# Patient Record
Sex: Female | Born: 1990 | Race: Black or African American | Hispanic: No | Smoking: Never smoker
Health system: Southern US, Community
[De-identification: ages and names within clinical notes are randomized; demographics above are authoritative.]

## PROBLEM LIST (undated history)

## (undated) ENCOUNTER — Inpatient Hospital Stay: Payer: Self-pay

## (undated) DIAGNOSIS — E119 Type 2 diabetes mellitus without complications: Secondary | ICD-10-CM

---

## 2013-05-14 ENCOUNTER — Emergency Department: Payer: Self-pay | Admitting: Emergency Medicine

## 2013-05-16 LAB — BETA STREP CULTURE(ARMC)

## 2013-12-30 ENCOUNTER — Emergency Department: Payer: Self-pay | Admitting: Emergency Medicine

## 2013-12-31 LAB — BETA STREP CULTURE(ARMC)

## 2014-01-28 ENCOUNTER — Emergency Department: Payer: Self-pay | Admitting: Internal Medicine

## 2014-01-28 LAB — CBC WITH DIFFERENTIAL/PLATELET
Basophil #: 0.1 10*3/uL (ref 0.0–0.1)
Basophil %: 0.8 %
Eosinophil #: 0.1 10*3/uL (ref 0.0–0.7)
Eosinophil %: 1 %
HCT: 37.8 % (ref 35.0–47.0)
HGB: 12.6 g/dL (ref 12.0–16.0)
LYMPHS ABS: 1.6 10*3/uL (ref 1.0–3.6)
Lymphocyte %: 24.4 %
MCH: 28.6 pg (ref 26.0–34.0)
MCHC: 33.4 g/dL (ref 32.0–36.0)
MCV: 86 fL (ref 80–100)
Monocyte #: 0.5 x10 3/mm (ref 0.2–0.9)
Monocyte %: 7.7 %
NEUTROS PCT: 66.1 %
Neutrophil #: 4.3 10*3/uL (ref 1.4–6.5)
PLATELETS: 247 10*3/uL (ref 150–440)
RBC: 4.42 10*6/uL (ref 3.80–5.20)
RDW: 14 % (ref 11.5–14.5)
WBC: 6.5 10*3/uL (ref 3.6–11.0)

## 2014-01-28 LAB — URINALYSIS, COMPLETE
Bacteria: NONE SEEN
Bilirubin,UR: NEGATIVE
Blood: NEGATIVE
GLUCOSE, UR: NEGATIVE mg/dL (ref 0–75)
KETONE: NEGATIVE
LEUKOCYTE ESTERASE: NEGATIVE
NITRITE: NEGATIVE
Ph: 7 (ref 4.5–8.0)
Protein: NEGATIVE
Specific Gravity: 1.016 (ref 1.003–1.030)
WBC UR: <1 /HPF (ref 0–5)

## 2014-01-28 LAB — COMPREHENSIVE METABOLIC PANEL
ALT: 16 U/L (ref 12–78)
Albumin: 3.6 g/dL (ref 3.4–5.0)
Alkaline Phosphatase: 59 U/L
Anion Gap: 6 — ABNORMAL LOW (ref 7–16)
BUN: 13 mg/dL (ref 7–18)
Bilirubin,Total: 0.3 mg/dL (ref 0.2–1.0)
Calcium, Total: 9 mg/dL (ref 8.5–10.1)
Chloride: 106 mmol/L (ref 98–107)
Co2: 24 mmol/L (ref 21–32)
Creatinine: 0.84 mg/dL (ref 0.60–1.30)
EGFR (African American): >60
EGFR (Non-African Amer.): >60
Glucose: 102 mg/dL — ABNORMAL HIGH (ref 65–99)
OSMOLALITY: 272 (ref 275–301)
Potassium: 3.8 mmol/L (ref 3.5–5.1)
SGOT(AST): 13 U/L — ABNORMAL LOW (ref 15–37)
Sodium: 136 mmol/L (ref 136–145)
TOTAL PROTEIN: 7.5 g/dL (ref 6.4–8.2)

## 2014-01-28 LAB — HCG, QUANTITATIVE, PREGNANCY: Beta Hcg, Quant.: 295 m[IU]/mL — ABNORMAL HIGH

## 2014-01-28 LAB — WET PREP, GENITAL

## 2014-01-28 LAB — GC/CHLAMYDIA PROBE AMP

## 2014-01-28 LAB — LIPASE, BLOOD: Lipase: 398 U/L — ABNORMAL HIGH (ref 73–393)

## 2014-02-06 ENCOUNTER — Emergency Department: Payer: Self-pay | Admitting: Emergency Medicine

## 2014-02-06 LAB — CBC
HCT: 35.7 % (ref 35.0–47.0)
HGB: 12 g/dL (ref 12.0–16.0)
MCH: 28.6 pg (ref 26.0–34.0)
MCHC: 33.6 g/dL (ref 32.0–36.0)
MCV: 85 fL (ref 80–100)
PLATELETS: 260 10*3/uL (ref 150–440)
RBC: 4.19 10*6/uL (ref 3.80–5.20)
RDW: 14 % (ref 11.5–14.5)
WBC: 6.3 10*3/uL (ref 3.6–11.0)

## 2014-02-06 LAB — HCG, QUANTITATIVE, PREGNANCY: Beta Hcg, Quant.: 14040 m[IU]/mL — ABNORMAL HIGH

## 2014-02-10 ENCOUNTER — Ambulatory Visit: Payer: Self-pay | Admitting: Obstetrics & Gynecology

## 2014-02-10 LAB — HCG, QUANTITATIVE, PREGNANCY: BETA HCG, QUANT.: 41014 m[IU]/mL — AB

## 2014-03-23 DIAGNOSIS — O09299 Supervision of pregnancy with other poor reproductive or obstetric history, unspecified trimester: Secondary | ICD-10-CM | POA: Insufficient documentation

## 2014-04-23 ENCOUNTER — Emergency Department: Payer: Self-pay | Admitting: Emergency Medicine

## 2014-04-23 LAB — HCG, QUANTITATIVE, PREGNANCY: Beta Hcg, Quant.: 16150 m[IU]/mL — ABNORMAL HIGH

## 2014-05-11 ENCOUNTER — Emergency Department: Payer: Self-pay | Admitting: Emergency Medicine

## 2014-05-11 LAB — URINALYSIS, COMPLETE
BILIRUBIN, UR: NEGATIVE
Blood: NEGATIVE
Glucose,UR: 50 mg/dL (ref 0–75)
Ketone: NEGATIVE
LEUKOCYTE ESTERASE: NEGATIVE
Nitrite: NEGATIVE
PROTEIN: NEGATIVE
Ph: 6 (ref 4.5–8.0)
RBC,UR: NONE SEEN /HPF (ref 0–5)
Specific Gravity: 1.015 (ref 1.003–1.030)
WBC UR: 2 /HPF (ref 0–5)

## 2014-05-11 LAB — WET PREP, GENITAL

## 2014-05-11 LAB — GC/CHLAMYDIA PROBE AMP

## 2014-05-11 LAB — HCG, QUANTITATIVE, PREGNANCY: Beta Hcg, Quant.: 9400 m[IU]/mL — ABNORMAL HIGH

## 2014-05-12 LAB — URINE CULTURE

## 2014-12-28 ENCOUNTER — Emergency Department: Payer: Self-pay | Admitting: Emergency Medicine

## 2015-01-31 ENCOUNTER — Emergency Department: Payer: Self-pay | Admitting: Emergency Medicine

## 2015-03-15 ENCOUNTER — Emergency Department: Payer: Self-pay

## 2015-03-15 ENCOUNTER — Emergency Department
Admission: EM | Admit: 2015-03-15 | Discharge: 2015-03-15 | Disposition: A | Discharge status: Home / Self Care | Payer: Self-pay | Attending: Emergency Medicine | Admitting: Emergency Medicine

## 2015-03-15 DIAGNOSIS — Z880 Allergy status to penicillin: Secondary | ICD-10-CM | POA: Insufficient documentation

## 2015-03-15 DIAGNOSIS — N83201 Unspecified ovarian cyst, right side: Secondary | ICD-10-CM

## 2015-03-15 DIAGNOSIS — R102 Pelvic and perineal pain: Secondary | ICD-10-CM

## 2015-03-15 DIAGNOSIS — Z3202 Encounter for pregnancy test, result negative: Secondary | ICD-10-CM | POA: Insufficient documentation

## 2015-03-15 DIAGNOSIS — E119 Type 2 diabetes mellitus without complications: Secondary | ICD-10-CM | POA: Insufficient documentation

## 2015-03-15 DIAGNOSIS — N8320 Unspecified ovarian cysts: Secondary | ICD-10-CM | POA: Insufficient documentation

## 2015-03-15 DIAGNOSIS — Z793 Long term (current) use of hormonal contraceptives: Secondary | ICD-10-CM | POA: Insufficient documentation

## 2015-03-15 HISTORY — DX: Type 2 diabetes mellitus without complications: E11.9

## 2015-03-15 LAB — URINALYSIS COMPLETE WITH MICROSCOPIC (ARMC ONLY)
BILIRUBIN URINE: NEGATIVE
Bacteria, UA: NONE SEEN
Glucose, UA: NEGATIVE mg/dL
Ketones, ur: NEGATIVE mg/dL
LEUKOCYTES UA: NEGATIVE
Nitrite: NEGATIVE
PH: 6 (ref 5.0–8.0)
PROTEIN: NEGATIVE mg/dL
RBC / HPF: NONE SEEN RBC/hpf (ref 0–5)
Specific Gravity, Urine: 1.003 — ABNORMAL LOW (ref 1.005–1.030)
Squamous Epithelial / LPF: NONE SEEN

## 2015-03-15 LAB — PREGNANCY, URINE: PREG TEST UR: NEGATIVE

## 2015-03-15 LAB — POCT PREGNANCY, URINE: Preg Test, Ur: NEGATIVE

## 2015-03-15 NOTE — ED Notes (Signed)
Pt complains of cramping and her period being 13 days late.  Reports neg preg test at home.

## 2015-03-15 NOTE — Discharge Instructions (Signed)
Abdominal Pain, Women °Abdominal (stomach, pelvic, or belly) pain can be caused by many things. It is important to tell your doctor: °· The location of the pain. °· Does it come and go or is it present all the time? °· Are there things that start the pain (eating certain foods, exercise)? °· Are there other symptoms associated with the pain (fever, nausea, vomiting, diarrhea)? °All of this is helpful to know when trying to find the cause of the pain. °CAUSES  °· Stomach: virus or bacteria infection, or ulcer. °· Intestine: appendicitis (inflamed appendix), regional ileitis (Crohn's disease), ulcerative colitis (inflamed colon), irritable bowel syndrome, diverticulitis (inflamed diverticulum of the colon), or cancer of the stomach or intestine. °· Gallbladder disease or stones in the gallbladder. °· Kidney disease, kidney stones, or infection. °· Pancreas infection or cancer. °· Fibromyalgia (pain disorder). °· Diseases of the female organs: °· Uterus: fibroid (non-cancerous) tumors or infection. °· Fallopian tubes: infection or tubal pregnancy. °· Ovary: cysts or tumors. °· Pelvic adhesions (scar tissue). °· Endometriosis (uterus lining tissue growing in the pelvis and on the pelvic organs). °· Pelvic congestion syndrome (female organs filling up with blood just before the menstrual period). °· Pain with the menstrual period. °· Pain with ovulation (producing an egg). °· Pain with an IUD (intrauterine device, birth control) in the uterus. °· Cancer of the female organs. °· Functional pain (pain not caused by a disease, may improve without treatment). °· Psychological pain. °· Depression. °DIAGNOSIS  °Your doctor will decide the seriousness of your pain by doing an examination. °· Blood tests. °· X-rays. °· Ultrasound. °· CT scan (computed tomography, special type of X-ray). °· MRI (magnetic resonance imaging). °· Cultures, for infection. °· Barium enema (dye inserted in the large intestine, to better view it with  X-rays). °· Colonoscopy (looking in intestine with a lighted tube). °· Laparoscopy (minor surgery, looking in abdomen with a lighted tube). °· Major abdominal exploratory surgery (looking in abdomen with a large incision). °TREATMENT  °The treatment will depend on the cause of the pain.  °· Many cases can be observed and treated at home. °· Over-the-counter medicines recommended by your caregiver. °· Prescription medicine. °· Antibiotics, for infection. °· Birth control pills, for painful periods or for ovulation pain. °· Hormone treatment, for endometriosis. °· Nerve blocking injections. °· Physical therapy. °· Antidepressants. °· Counseling with a psychologist or psychiatrist. °· Minor or major surgery. °HOME CARE INSTRUCTIONS  °· Do not take laxatives, unless directed by your caregiver. °· Take over-the-counter pain medicine only if ordered by your caregiver. Do not take aspirin because it can cause an upset stomach or bleeding. °· Try a clear liquid diet (broth or water) as ordered by your caregiver. Slowly move to a bland diet, as tolerated, if the pain is related to the stomach or intestine. °· Have a thermometer and take your temperature several times a day, and record it. °· Bed rest and sleep, if it helps the pain. °· Avoid sexual intercourse, if it causes pain. °· Avoid stressful situations. °· Keep your follow-up appointments and tests, as your caregiver orders. °· If the pain does not go away with medicine or surgery, you may try: °¨ Acupuncture. °¨ Relaxation exercises (yoga, meditation). °¨ Group therapy. °¨ Counseling. °SEEK MEDICAL CARE IF:  °· You notice certain foods cause stomach pain. °· Your home care treatment is not helping your pain. °· You need stronger pain medicine. °· You want your IUD removed. °· You feel faint or   lightheaded. °· You develop nausea and vomiting. °· You develop a rash. °· You are having side effects or an allergy to your medicine. °SEEK IMMEDIATE MEDICAL CARE IF:  °· Your  pain does not go away or gets worse. °· You have a fever. °· Your pain is felt only in portions of the abdomen. The right side could possibly be appendicitis. The left lower portion of the abdomen could be colitis or diverticulitis. °· You are passing blood in your stools (bright red or black tarry stools, with or without vomiting). °· You have blood in your urine. °· You develop chills, with or without a fever. °· You pass out. °MAKE SURE YOU:  °· Understand these instructions. °· Will watch your condition. °· Will get help right away if you are not doing well or get worse. °Document Released: 08/18/2007 Document Revised: 03/07/2014 Document Reviewed: 09/07/2009 °[employer]® Patient Information ©2015 [employer]. This information is not intended to replace advice given to you by your health care provider. Make sure you discuss any questions you have with your health care provider. ° °Ovarian Cyst °An ovarian cyst is a sac filled with fluid or blood. This sac is attached to the ovary. Some cysts go away on their own. Other cysts need treatment.  °HOME CARE  °· Only take medicine as told by your doctor. °· Follow up with your doctor as told. °· Get regular pelvic exams and Pap tests. °GET HELP IF: °· Your periods are late, not regular, or painful. °· You stop having periods. °· Your belly (abdominal) or pelvic pain does not go away. °· Your belly becomes large or puffy (swollen). °· You have a hard time peeing (totally emptying your bladder). °· You have pressure on your bladder. °· You have pain during sex. °· You feel fullness, pressure, or discomfort in your belly. °· You lose weight for no reason. °· You feel sick most of the time. °· You have a hard time pooping (constipation). °· You do not feel like eating. °· You develop pimples (acne). °· You have an increase in hair on your body and face. °· You are gaining weight for no reason. °· You think you are pregnant. °GET HELP RIGHT AWAY IF:  °· Your belly pain  gets worse. °· You feel sick to your stomach (nauseous), and you throw up (vomit). °· You have a fever that comes on fast. °· You have belly pain while pooping (bowel movement). °· Your periods are heavier than usual. °MAKE SURE YOU:  °· Understand these instructions. °· Will watch your condition. °· Will get help right away if you are not doing well or get worse. °Document Released: 04/08/2008 Document Revised: 08/11/2013 Document Reviewed: 06/28/2013 °[employer]® Patient Information ©2015 [employer]. This information is not intended to replace advice given to you by your health care provider. Make sure you discuss any questions you have with your health care provider. ° °

## 2015-03-15 NOTE — ED Provider Notes (Signed)
[facility] Emergency Department Provider Note    Time seen: 1630  I have reviewed the triage vital signs and the nursing notes.   HISTORY  Chief Complaint Abdominal Cramping    HPI Danielle Mcdaniel is a 24 y.o. female patient's ER for amenorrhea. Patient states this is the fourth time in the last 6 months perhaps that her period has been late, she is taken home pregnancy test and they have all been negative. Does describe some mild to moderate right sided abdominal and pelvic cramping. Denies any vaginal discharge or any spotting or bleeding of any kind.    Past Medical History  Diagnosis Date  . Diabetes mellitus without complication     There are no active problems to display for this patient.   Past Surgical History  Procedure Laterality Date  . Cesarean section      No current outpatient prescriptions on file.  Allergies Penicillins  No family history on file.  Social History History  Substance Use Topics  . Smoking status: Never Smoker   . Smokeless tobacco: Not on file  . Alcohol Use: Yes    Review of Systems Constitutional: Negative for fever. Eyes: Negative for visual changes. ENT: Negative for sore throat. Cardiovascular: Negative for chest pain. Respiratory: Negative for shortness of breath. Gastrointestinal: Negative for abdominal pain, vomiting and diarrhea. Genitourinary: Negative for dysuria, positive for pelvic cramping Musculoskeletal: Negative for back pain. Skin: Negative for rash. Neurological: Negative for headaches, focal weakness or numbness.  10-point ROS otherwise negative.  ____________________________________________   PHYSICAL EXAM:  VITAL SIGNS: ED Triage Vitals  Enc Vitals Group     BP 03/15/15 1306 133/72 mmHg     Pulse Rate 03/15/15 1306 75     Resp 03/15/15 1306 18     Temp 03/15/15 1306 98.2 F (36.8 C)     Temp Source 03/15/15 1306 Oral     SpO2 03/15/15 1306 99 %     Weight 03/15/15  1306 240 lb (108.863 kg)     Height 03/15/15 1306 5\' 6"  (1.676 m)     Head Cir --      Peak Flow --      Pain Score 03/15/15 1307 2     Pain Loc --      Pain Edu? --      Excl. in GC? --     Constitutional: Alert and oriented. Well appearing and in no distress. Eyes: Conjunctivae are normal. PERRL. Normal extraocular movements. ENT   Head: Normocephalic and atraumatic.   Nose: No congestion/rhinnorhea.   Mouth/Throat: Mucous membranes are moist.   Neck: No stridor. Hematological/Lymphatic/Immunilogical: No cervical lymphadenopathy. Cardiovascular: Normal rate, regular rhythm. Normal and symmetric distal pulses are present in all extremities. No murmurs, rubs, or gallops. Respiratory: Normal respiratory effort without tachypnea nor retractions. Breath sounds are clear and equal bilaterally. No wheezes/rales/rhonchi. Gastrointestinal: Soft and nontender. No distention. No abdominal bruits. There is no CVA tenderness. Musculoskeletal: Nontender with normal range of motion in all extremities. No joint effusions.  No lower extremity tenderness nor edema. Neurologic:  Normal speech and language. No gross focal neurologic deficits are appreciated. Speech is normal. No gait instability. Skin:  Skin is warm, dry and intact. No rash noted. Psychiatric: Mood and affect are normal. Speech and behavior are normal. Patient exhibits appropriate insight and judgment.  ____________________________________________    LABS (pertinent positives/negatives)  Labs Reviewed  URINALYSIS COMPLETEWITH MICROSCOPIC ([facility])  - Abnormal; Notable for the following:  Color, Urine COLORLESS (*)    APPearance CLEAR (*)    Specific Gravity, Urine 1.003 (*)    Hgb urine dipstick 1+ (*)    All other components within normal limits  PREGNANCY, URINE  POCT PREGNANCY, URINE     ____________________________________________    RADIOLOGY  Pelvic  ultrasound  ____________________________________________    ED COURSE  Pertinent labs & imaging results that were available during my care of the patient were reviewed by me and considered in my medical decision making (see chart for details).  Patient has been on exam, we will ultrasound due to frequent ER visits for same.  FINAL ASSESSMENT AND PLAN  Irregular menses   Plan: Patient declines oral contraceptive pills, we'll refer her to OB/GYN for follow-up    Emily FilbertWilliams, Andree Heeg E, MD   Emily FilbertJonathan E Dorethy Tomey, MD 03/15/15 2123

## 2015-03-15 NOTE — ED Notes (Signed)
Pt reports 4th time her period has been late.  Reports period is 13 days late.  Reports took pregnancy test at home and they have been negative.  Complains of cramping.

## 2015-03-15 NOTE — ED Notes (Signed)
Urine pregnancy -  Negative 

## 2015-05-01 ENCOUNTER — Emergency Department
Admission: EM | Admit: 2015-05-01 | Discharge: 2015-05-01 | Disposition: A | Discharge status: Home / Self Care | Payer: Self-pay | Attending: Emergency Medicine | Admitting: Emergency Medicine

## 2015-05-01 ENCOUNTER — Encounter: Payer: Self-pay | Admitting: General Practice

## 2015-05-01 DIAGNOSIS — Z3201 Encounter for pregnancy test, result positive: Secondary | ICD-10-CM | POA: Insufficient documentation

## 2015-05-01 DIAGNOSIS — Z331 Pregnant state, incidental: Secondary | ICD-10-CM | POA: Insufficient documentation

## 2015-05-01 DIAGNOSIS — Z3490 Encounter for supervision of normal pregnancy, unspecified, unspecified trimester: Secondary | ICD-10-CM

## 2015-05-01 DIAGNOSIS — E119 Type 2 diabetes mellitus without complications: Secondary | ICD-10-CM | POA: Insufficient documentation

## 2015-05-01 DIAGNOSIS — Z880 Allergy status to penicillin: Secondary | ICD-10-CM | POA: Insufficient documentation

## 2015-05-01 LAB — POCT PREGNANCY, URINE: Preg Test, Ur: POSITIVE — AB

## 2015-05-01 NOTE — ED Notes (Signed)
Pt. Arrived to ed from home with reports of needing a pregnancy test. Pt states "i am pretty sure i am pregnant and i need proof for social services". Pt alert and oriented. Denies vaginal discharge at this time.

## 2015-05-01 NOTE — ED Provider Notes (Signed)
[facility] Emergency Department Provider Note  ____________________________________________  Time seen: Approximately 4:20 PM  I have reviewed the triage vital signs and the nursing notes.   HISTORY  Chief Complaint Possible Pregnancy    HPI Danielle Mcdaniel is a 24 y.o. female presents for request seen a pregnancy test. Patient states her LMP was 03/22/2015. Denies any complaints at this time.   Past Medical History  Diagnosis Date  . Diabetes mellitus without complication     There are no active problems to display for this patient.   Past Surgical History  Procedure Laterality Date  . Cesarean section      No current outpatient prescriptions on file.  Allergies Penicillins  No family history on file.  Social History History  Substance Use Topics  . Smoking status: Never Smoker   . Smokeless tobacco: Not on file  . Alcohol Use: Yes    Review of Systems Constitutional: No fever/chills Eyes: No visual changes. ENT: No sore throat. Cardiovascular: Denies chest pain. Respiratory: Denies shortness of breath. Gastrointestinal: No abdominal pain.  No nausea, no vomiting.  No diarrhea.  No constipation. Genitourinary: Negative for dysuria. Positive for amenorrhea Musculoskeletal: Negative for back pain. Skin: Negative for rash. Neurological: Negative for headaches, focal weakness or numbness.  10-point ROS otherwise negative.  ____________________________________________   PHYSICAL EXAM:  VITAL SIGNS: ED Triage Vitals  Enc Vitals Group     BP 05/01/15 1602 136/67 mmHg     Pulse Rate 05/01/15 1602 74     Resp 05/01/15 1602 18     Temp 05/01/15 1602 98.4 F (36.9 C)     Temp Source 05/01/15 1602 Oral     SpO2 05/01/15 1602 100 %     Weight 05/01/15 1602 260 lb (117.935 kg)     Height 05/01/15 1602  (1.676 m)     Head Cir --      Peak Flow --      Pain Score --      Pain Loc --      Pain Edu? --      Excl. in GC? --      Constitutional: Alert and oriented. Well appearing and in no acute distress.  Cardiovascular: Normal rate, regular rhythm. Grossly normal heart sounds.  Good peripheral circulation. Respiratory: Normal respiratory effort.  No retractions. Lungs CTAB. Gastrointestinal: Soft and nontender. No distention. No abdominal bruits. No CVA tenderness. Musculoskeletal: No lower extremity tenderness nor edema.  No joint effusions. Neurologic:  Normal speech and language. No gross focal neurologic deficits are appreciated. Speech is normal. No gait instability. Skin:  Skin is warm, dry and intact. No rash noted. Psychiatric: Mood and affect are normal. Speech and behavior are normal.  ____________________________________________   LABS (all labs ordered are listed, but only abnormal results are displayed)  Labs Reviewed  POCT PREGNANCY, URINE - Abnormal; Notable for the following:    Preg Test, Ur POSITIVE (*)    All other components within normal limits  POC URINE PREG, ED   ____________________________________________  PROCEDURES  Procedure(s) performed: None  Critical Care performed: No  ____________________________________________   INITIAL IMPRESSION / ASSESSMENT AND PLAN / ED COURSE  Pertinent labs & imaging results that were available during my care of the patient were reviewed by me and considered in my medical decision making (see chart for details).  Positive for pregnancy. Encouraged patient to follow up and seek OB care. Patient voices understanding and will return to the ER with  any worsening symptomology. Denies any other emergency medical complaints at this visit. ____________________________________________   FINAL CLINICAL IMPRESSION(S) / ED DIAGNOSES  Final diagnoses:  Pregnancy      Evangeline Dakinharles M Beers, PA-C 05/01/15 1648  Maurilio LovelyNoelle McLaurin, MD 05/01/15 2214

## 2015-05-01 NOTE — ED Notes (Signed)
States had 2 positive pregnancy tests yesterday, here to confirm pregnancy

## 2015-05-15 ENCOUNTER — Emergency Department
Admission: EM | Admit: 2015-05-15 | Discharge: 2015-05-15 | Disposition: A | Discharge status: Home / Self Care | Payer: Medicaid Other | Attending: Emergency Medicine | Admitting: Emergency Medicine

## 2015-05-15 ENCOUNTER — Encounter: Payer: Self-pay | Admitting: Emergency Medicine

## 2015-05-15 ENCOUNTER — Emergency Department: Payer: Medicaid Other

## 2015-05-15 DIAGNOSIS — N939 Abnormal uterine and vaginal bleeding, unspecified: Secondary | ICD-10-CM

## 2015-05-15 DIAGNOSIS — O209 Hemorrhage in early pregnancy, unspecified: Secondary | ICD-10-CM | POA: Diagnosis present

## 2015-05-15 DIAGNOSIS — O200 Threatened abortion: Secondary | ICD-10-CM | POA: Insufficient documentation

## 2015-05-15 DIAGNOSIS — Z880 Allergy status to penicillin: Secondary | ICD-10-CM | POA: Insufficient documentation

## 2015-05-15 DIAGNOSIS — O24111 Pre-existing diabetes mellitus, type 2, in pregnancy, first trimester: Secondary | ICD-10-CM | POA: Diagnosis not present

## 2015-05-15 DIAGNOSIS — Z3A01 Less than 8 weeks gestation of pregnancy: Secondary | ICD-10-CM | POA: Diagnosis not present

## 2015-05-15 LAB — URINALYSIS COMPLETE WITH MICROSCOPIC (ARMC ONLY)
Bilirubin Urine: NEGATIVE
Glucose, UA: NEGATIVE mg/dL
KETONES UR: NEGATIVE mg/dL
Leukocytes, UA: NEGATIVE
Nitrite: NEGATIVE
PH: 6 (ref 5.0–8.0)
Protein, ur: NEGATIVE mg/dL
Specific Gravity, Urine: 1.018 (ref 1.005–1.030)

## 2015-05-15 LAB — WET PREP, GENITAL
Clue Cells Wet Prep HPF POC: NONE SEEN
Trich, Wet Prep: NONE SEEN
Yeast Wet Prep HPF POC: NONE SEEN

## 2015-05-15 LAB — HCG, QUANTITATIVE, PREGNANCY: hCG, Beta Chain, Quant, S: 33470 m[IU]/mL — ABNORMAL HIGH (ref <5)

## 2015-05-15 LAB — POC URINE PREG, ED: Preg Test, Ur: POSITIVE — AB

## 2015-05-15 LAB — CHLAMYDIA/NGC RT PCR (ARMC ONLY)
Chlamydia Tr: NOT DETECTED
N gonorrhoeae: NOT DETECTED

## 2015-05-15 LAB — CBC
HCT: 36.6 % (ref 35.0–47.0)
Hemoglobin: 12.2 g/dL (ref 12.0–16.0)
MCH: 27.8 pg (ref 26.0–34.0)
MCHC: 33.4 g/dL (ref 32.0–36.0)
MCV: 83.2 fL (ref 80.0–100.0)
Platelets: 295 10*3/uL (ref 150–440)
RBC: 4.4 MIL/uL (ref 3.80–5.20)
RDW: 13.7 % (ref 11.5–14.5)
WBC: 5.9 10*3/uL (ref 3.6–11.0)

## 2015-05-15 LAB — ABO/RH
ABO/RH(D): B POS
ABO/RH(D): B POS

## 2015-05-15 NOTE — Discharge Instructions (Signed)
Please seek medical attention for any high fevers, chest pain, shortness of breath, change in behavior, persistent vomiting, bloody stool or any other new or concerning symptoms. ° ° °Threatened Miscarriage °A threatened miscarriage occurs when you have vaginal bleeding during your first 20 weeks of pregnancy but the pregnancy has not ended. If you have vaginal bleeding during this time, your health care provider will do tests to make sure you are still pregnant. If the tests show you are still pregnant and the developing baby (fetus) inside your womb (uterus) is still growing, your condition is considered a threatened miscarriage. °A threatened miscarriage does not mean your pregnancy will end, but it does increase the risk of losing your pregnancy (complete miscarriage). °CAUSES  °The cause of a threatened miscarriage is usually not known. If you go on to have a complete miscarriage, the most common cause is an abnormal number of chromosomes in the developing baby. Chromosomes are the structures inside cells that hold all your genetic material. °Some causes of vaginal bleeding that do not result in miscarriage include: °· Having sex. °· Having an infection. °· Normal hormone changes of pregnancy. °· Bleeding that occurs when an egg implants in your uterus. °RISK FACTORS °Risk factors for bleeding in early pregnancy include: °· Obesity. °· Smoking. °· Drinking excessive amounts of alcohol or caffeine. °· Recreational drug use. °SIGNS AND SYMPTOMS °· Light vaginal bleeding. °· Mild abdominal pain or cramps. °DIAGNOSIS  °If you have bleeding with or without abdominal pain before 20 weeks of pregnancy, your health care provider will do tests to check whether you are still pregnant. One important test involves using sound waves and a computer (ultrasound) to create images of the inside of your uterus. Other tests include an internal exam of your vagina and uterus (pelvic exam) and measurement of your baby's heart rate.   °You may be diagnosed with a threatened miscarriage if: °· Ultrasound testing shows you are still pregnant. °· Your baby's heart rate is strong. °· A pelvic exam shows that the opening between your uterus and your vagina (cervix) is closed. °· Your heart rate and blood pressure are stable. °· Blood tests confirm you are still pregnant. °TREATMENT  °No treatments have been shown to prevent a threatened miscarriage from going on to a complete miscarriage. However, the right home care is important.  °HOME CARE INSTRUCTIONS  °· Make sure you keep all your appointments for prenatal care. This is very important. °· Get plenty of rest. °· Do not have sex or use tampons if you have vaginal bleeding. °· Do not douche. °· Do not smoke or use recreational drugs. °· Do not drink alcohol. °· Avoid caffeine. °SEEK MEDICAL CARE IF: °· You have light vaginal bleeding or spotting while pregnant. °· You have abdominal pain or cramping. °· You have a fever. °SEEK IMMEDIATE MEDICAL CARE IF: °· You have heavy vaginal bleeding. °· You have blood clots coming from your vagina. °· You have severe low back pain or abdominal cramps. °· You have fever, chills, and severe abdominal pain. °MAKE SURE YOU: °· Understand these instructions. °· Will watch your condition. °· Will get help right away if you are not doing well or get worse. °Document Released: 10/21/2005 Document Revised: 10/26/2013 Document Reviewed: 08/17/2013 °[employer]® Patient Information ©2015 [employer]. This information is not intended to replace advice given to you by your health care provider. Make sure you discuss any questions you have with your health care provider. ° °

## 2015-05-15 NOTE — ED Notes (Signed)
Pt presents to ER alert and in NAD. pt reports she is approx [redacted] weeks pregnant and has not had any prenatal care yet. Pt reports she woke up this morning with dark red vaginal bleeding.

## 2015-05-15 NOTE — ED Provider Notes (Signed)
[facility] Emergency Department Provider Note  ____________________________________________  Time seen: 0730  I have reviewed the triage vital signs and the nursing notes.   HISTORY  Chief Complaint Vaginal Bleeding   History limited by: Not Limited   HPI Danielle Mcdaniel is a 24 y.o. female at roughly [redacted] weeks pregnant who presents to the emergency department today because of vaginal bleeding. The patient states she woke up this morning and noticed blood. It was dark red. No passage of tissue noted. She states she thinks she has now stopped bleeding. She did not have any pain associated with the bleeding. She has had some mild cramping with this pregnancy has not experienced any significant nausea or vomiting. States she is taking her prenatal vitamins. States she has her first appointment with her OB/GYN doctor tomorrow.     Past Medical History  Diagnosis Date  . Diabetes mellitus without complication     There are no active problems to display for this patient.   Past Surgical History  Procedure Laterality Date  . Cesarean section      No current outpatient prescriptions on file.  Allergies Penicillins  History reviewed. No pertinent family history.  Social History History  Substance Use Topics  . Smoking status: Never Smoker   . Smokeless tobacco: Not on file  . Alcohol Use: Yes    Review of Systems Constitutional: Negative for fever. Cardiovascular: Negative for chest pain. Respiratory: Negative for shortness of breath. Gastrointestinal: Minimal lower abdominal cramping Genitourinary: Negative for dysuria. Musculoskeletal: Negative for back pain. Skin: Negative for rash. Neurological: Negative for headaches, focal weakness or numbness.   10-point ROS otherwise negative.  ____________________________________________   PHYSICAL EXAM:  VITAL SIGNS: ED Triage Vitals  Enc Vitals Group     BP 05/15/15 0529 144/76 mmHg   Pulse Rate 05/15/15 0529 95     Resp 05/15/15 0529 20     Temp 05/15/15 0529 98.9 F (37.2 C)     Temp Source 05/15/15 0529 Oral     SpO2 05/15/15 0529 99 %     Weight 05/15/15 0529 254 lb (115.214 kg)     Height 05/15/15 0529 5\' 6"  (1.676 m)     Head Cir --      Peak Flow --      Pain Score 05/15/15 0529 2   Constitutional: Alert and oriented. Well appearing and in no distress. Eyes: Conjunctivae are normal. PERRL. Normal extraocular movements. ENT   Head: Normocephalic and atraumatic.   Nose: No congestion/rhinnorhea.   Mouth/Throat: Mucous membranes are moist.   Neck: No stridor. Hematological/Lymphatic/Immunilogical: No cervical lymphadenopathy. Cardiovascular: Normal rate, regular rhythm.  No murmurs, rubs, or gallops. Respiratory: Normal respiratory effort without tachypnea nor retractions. Breath sounds are clear and equal bilaterally. No wheezes/rales/rhonchi. Gastrointestinal: Soft and nontender. No distention. There is no CVA tenderness. Genitourinary: No external lesions. Old blood in vaginal vault, no active bleeding identified. No CMT. No adnexal fullness or tenderness.  Musculoskeletal: Normal range of motion in all extremities. No joint effusions.  No lower extremity tenderness nor edema. Neurologic:  Normal speech and language. No gross focal neurologic deficits are appreciated. Speech is normal.  Skin:  Skin is warm, dry and intact. No rash noted. Psychiatric: Mood and affect are normal. Speech and behavior are normal. Patient exhibits appropriate insight and judgment.  ____________________________________________    LABS (pertinent positives/negatives)  Labs Reviewed  URINALYSIS COMPLETEWITH MICROSCOPIC ([facility] ONLY) - Abnormal; Notable for the following:    Color,  Urine YELLOW (*)    APPearance CLEAR (*)    Hgb urine dipstick 3+ (*)    Bacteria, UA RARE (*)    Squamous Epithelial / LPF 0-5 (*)    All other components within normal limits  POC  URINE PREG, ED - Abnormal; Notable for the following:    Preg Test, Ur Positive (*)    All other components within normal limits  CBC  HCG, QUANTITATIVE, PREGNANCY  ABO/RH  ABO/RH     ____________________________________________   EKG  None  ____________________________________________    RADIOLOGY  US Ob Transvaginal IMPRESSION: Single intrauterine gestational sac. Estimated gestational age by crown-rump length is 6 weeks 0 days. Moderate size subchorionic hemorrhage is present. Corpus luteal and simple cyst identified in the right ovary.  ____________________________________________   PROCEDURES  Procedure(s) performed: None  Critical Care performed: No  ____________________________________________   INITIAL IMPRESSION / ASSESSMENT AND PLAN / ED COURSE  Pertinent labs & imaging results that were available during my care of the patient were reviewed by me and considered in my medical decision making (see chart for details).  Patient presents to the emergency department at roughly [redacted] weeks pregnant with complaints of vaginal bleeding. Workup without any concerning findings. Patient is rh positive. Has an appointment with her OB/GYN tomorrow.  ____________________________________________   FINAL CLINICAL IMPRESSION(S) / ED DIAGNOSES  Final diagnoses:  Vaginal bleeding     Phineas Semen, MD 05/15/15 [phone]

## 2015-05-19 ENCOUNTER — Emergency Department: Payer: Medicaid Other

## 2015-05-19 ENCOUNTER — Emergency Department
Admission: EM | Admit: 2015-05-19 | Discharge: 2015-05-19 | Disposition: A | Discharge status: Home / Self Care | Payer: Medicaid Other | Attending: Emergency Medicine | Admitting: Emergency Medicine

## 2015-05-19 DIAGNOSIS — Z880 Allergy status to penicillin: Secondary | ICD-10-CM | POA: Diagnosis not present

## 2015-05-19 DIAGNOSIS — O9989 Other specified diseases and conditions complicating pregnancy, childbirth and the puerperium: Secondary | ICD-10-CM | POA: Diagnosis not present

## 2015-05-19 DIAGNOSIS — O209 Hemorrhage in early pregnancy, unspecified: Secondary | ICD-10-CM | POA: Diagnosis present

## 2015-05-19 DIAGNOSIS — Z3A01 Less than 8 weeks gestation of pregnancy: Secondary | ICD-10-CM | POA: Diagnosis not present

## 2015-05-19 DIAGNOSIS — O24111 Pre-existing diabetes mellitus, type 2, in pregnancy, first trimester: Secondary | ICD-10-CM | POA: Insufficient documentation

## 2015-05-19 DIAGNOSIS — O99213 Obesity complicating pregnancy, third trimester: Secondary | ICD-10-CM | POA: Insufficient documentation

## 2015-05-19 DIAGNOSIS — Z789 Other specified health status: Secondary | ICD-10-CM | POA: Insufficient documentation

## 2015-05-19 DIAGNOSIS — R102 Pelvic and perineal pain: Secondary | ICD-10-CM | POA: Diagnosis not present

## 2015-05-19 DIAGNOSIS — Z98891 History of uterine scar from previous surgery: Secondary | ICD-10-CM | POA: Insufficient documentation

## 2015-05-19 DIAGNOSIS — O26899 Other specified pregnancy related conditions, unspecified trimester: Secondary | ICD-10-CM

## 2015-05-19 LAB — HCG, QUANTITATIVE, PREGNANCY: hCG, Beta Chain, Quant, S: 71539 m[IU]/mL — ABNORMAL HIGH (ref <5)

## 2015-05-19 NOTE — ED Notes (Signed)
Pt c/o suprapubic cramping with light spotting today, states she is [redacted] weeks pregnant and has [facility]

## 2015-05-19 NOTE — ED Notes (Signed)
Patient with no complaints at this time. Respirations even and unlabored. Skin warm/dry. Discharge instructions reviewed with patient at this time. Patient given opportunity to voice concerns/ask questions. Patient discharged at this time and left Emergency Department with steady gait.   

## 2015-05-19 NOTE — ED Provider Notes (Signed)
[facility] Emergency Department Provider Note ____________________________________________  Time seen: Approximately 10:16 PM  I have reviewed the triage vital signs and the nursing notes.   HISTORY  Chief Complaint Vaginal Bleeding   HPI Barney DrainDanajai J Mangione is a 24 y.o. female who presents to the emergency department for evaluation of vaginal bleeding and cramping. Cramping was severe earlier, but very mild now and infrequent. She states the spotting is only present when she wipes and denies urinary symptoms. She has [facility] since her last visit here on 05/15/15. She is a high risk pregnancy. G2P0A0. Her last child was born with congenital abnormalities and passed away 1 hour after birth. She denies nausea or vomiting.   Past Medical History  Diagnosis Date  . Diabetes mellitus without complication     There are no active problems to display for this patient.   Past Surgical History  Procedure Laterality Date  . Cesarean section      No current outpatient prescriptions on file.  Allergies Penicillins  No family history on file.  Social History History  Substance Use Topics  . Smoking status: Never Smoker   . Smokeless tobacco: Not on file  . Alcohol Use: Yes    Review of Systems Constitutional: No fever/chills Cardiovascular: Denies chest pain. Respiratory: Denies shortness of breath or cough. Gastrointestinal: Abdominal pain yes., nausea no, vomiting no. Genitourinary: Dysuria no, vaginal discharge no.. Musculoskeletal: Negative for back pain. Skin: Negative for rash. Neurological: Negative for headaches, focal weakness or numbness.  10-point ROS otherwise negative.  ____________________________________________   PHYSICAL EXAM:  VITAL SIGNS: ED Triage Vitals  Enc Vitals Group     BP 05/19/15 1808 104/82 mmHg     Pulse Rate 05/19/15 1808 85     Resp 05/19/15 1808 18     Temp 05/19/15 1808 98.8 F (37.1 C)     Temp  Source 05/19/15 1808 Oral     SpO2 05/19/15 1808 99 %     Weight 05/19/15 1808 252 lb (114.306 kg)     Height 05/19/15 1808 5\' 6"  (1.676 m)     Head Cir --      Peak Flow --      Pain Score --      Pain Loc --      Pain Edu? --      Excl. in GC? --     Constitutional: Alert and oriented. Well appearing and in no acute distress. Eyes: Conjunctivae are normal. PERRL. EOMI. Head: Atraumatic. Nose: No congestion/rhinnorhea. Mouth/Throat: Mucous membranes are moist.  Oropharynx non-erythematous. Neck: No stridor. Cardiovascular: Good peripheral circulation. Respiratory: Normal respiratory effort.  No retractions. Gastrointestinal: Soft and nontender. No distention. No abdominal bruits. Genitourinary: Pelvic exam: Deferred Musculoskeletal: No extremity tenderness nor edema.  Neurologic:  Normal speech and language. No gross focal neurologic deficits are appreciated. Speech is normal. No gait instability. Skin:  Skin is warm, dry and intact. No rash noted. Psychiatric: Mood and affect are normal. Speech and behavior are normal.  ____________________________________________   LABS (all labs ordered are listed, but only abnormal results are displayed)  Labs Reviewed  HCG, QUANTITATIVE, PREGNANCY - Abnormal; Notable for the following:    hCG, Beta Chain, Quant, [place] [id number] (*)    All other components within normal limits   ____________________________________________  RADIOLOGY  Single, live IUP with small subchorionic hemorrhage. Improved since last US on 05/15/15. 6 weeks 5 days. ____________________________________________   PROCEDURES  Procedure(s) performed: None  ____________________________________________  INITIAL IMPRESSION / ASSESSMENT AND PLAN / ED COURSE  Pertinent labs & imaging results that were available during my care of the patient were reviewed by me and considered in my medical decision making (see chart for details).  Labs from last visit reviewed--B  Positive and Beta HCG has doubled since 05/15/15.  Patient was advised to observe pelvic rest until spotting has completely stopped or until cleared by her OB/GYN. She was advised to return to the ER for increase in pain or bleeding/gush of fluids. She was advised to follow up with OB/GYN as scheduled and to call tomorrow to advise them of today's visit. ____________________________________________   FINAL CLINICAL IMPRESSION(S) / ED DIAGNOSES  Final diagnoses:  Pelvic pain in pregnancy       Chinita Pester, FNP 05/19/15 2225  Darien Ramus, MD 05/19/15 [phone]

## 2015-05-19 NOTE — Discharge Instructions (Signed)

## 2015-08-15 ENCOUNTER — Emergency Department
Admission: EM | Admit: 2015-08-15 | Discharge: 2015-08-15 | Disposition: A | Discharge status: Home / Self Care | Payer: Medicaid Other | Attending: Emergency Medicine | Admitting: Emergency Medicine

## 2015-08-15 ENCOUNTER — Encounter: Payer: Self-pay | Admitting: Emergency Medicine

## 2015-08-15 DIAGNOSIS — Y9389 Activity, other specified: Secondary | ICD-10-CM | POA: Insufficient documentation

## 2015-08-15 DIAGNOSIS — Y998 Other external cause status: Secondary | ICD-10-CM | POA: Insufficient documentation

## 2015-08-15 DIAGNOSIS — Z043 Encounter for examination and observation following other accident: Secondary | ICD-10-CM | POA: Insufficient documentation

## 2015-08-15 DIAGNOSIS — O9A212 Injury, poisoning and certain other consequences of external causes complicating pregnancy, second trimester: Secondary | ICD-10-CM | POA: Diagnosis present

## 2015-08-15 DIAGNOSIS — Y92481 Parking lot as the place of occurrence of the external cause: Secondary | ICD-10-CM | POA: Insufficient documentation

## 2015-08-15 DIAGNOSIS — O24112 Pre-existing diabetes mellitus, type 2, in pregnancy, second trimester: Secondary | ICD-10-CM | POA: Diagnosis not present

## 2015-08-15 DIAGNOSIS — Z880 Allergy status to penicillin: Secondary | ICD-10-CM | POA: Insufficient documentation

## 2015-08-15 DIAGNOSIS — Z3490 Encounter for supervision of normal pregnancy, unspecified, unspecified trimester: Secondary | ICD-10-CM

## 2015-08-15 DIAGNOSIS — Z3A22 22 weeks gestation of pregnancy: Secondary | ICD-10-CM | POA: Insufficient documentation
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
## 2015-09-18 DIAGNOSIS — R509 Fever, unspecified: Secondary | ICD-10-CM | POA: Insufficient documentation

## 2017-07-13 ENCOUNTER — Encounter: Payer: Self-pay | Admitting: Emergency Medicine

## 2017-07-13 ENCOUNTER — Emergency Department
Admission: EM | Admit: 2017-07-13 | Discharge: 2017-07-13 | Disposition: A | Discharge status: Home / Self Care | Payer: Self-pay | Attending: Emergency Medicine | Admitting: Emergency Medicine

## 2017-07-13 ENCOUNTER — Emergency Department: Payer: Self-pay

## 2017-07-13 DIAGNOSIS — R102 Pelvic and perineal pain: Secondary | ICD-10-CM | POA: Insufficient documentation

## 2017-07-13 DIAGNOSIS — N760 Acute vaginitis: Secondary | ICD-10-CM | POA: Insufficient documentation

## 2017-07-13 DIAGNOSIS — Z3202 Encounter for pregnancy test, result negative: Secondary | ICD-10-CM | POA: Insufficient documentation

## 2017-07-13 DIAGNOSIS — E119 Type 2 diabetes mellitus without complications: Secondary | ICD-10-CM | POA: Insufficient documentation

## 2017-07-13 LAB — WET PREP, GENITAL
Clue Cells Wet Prep HPF POC: NONE SEEN
Sperm: NONE SEEN
TRICH WET PREP: NONE SEEN
YEAST WET PREP: NONE SEEN

## 2017-07-13 LAB — CBC
HCT: 37.9 % (ref 35.0–47.0)
Hemoglobin: 13.1 g/dL (ref 12.0–16.0)
MCH: 28.4 pg (ref 26.0–34.0)
MCHC: 34.6 g/dL (ref 32.0–36.0)
MCV: 82.1 fL (ref 80.0–100.0)
PLATELETS: 309 10*3/uL (ref 150–440)
RBC: 4.62 MIL/uL (ref 3.80–5.20)
RDW: 14.2 % (ref 11.5–14.5)
WBC: 8.6 10*3/uL (ref 3.6–11.0)

## 2017-07-13 LAB — COMPREHENSIVE METABOLIC PANEL
ALK PHOS: 64 U/L (ref 38–126)
ALT: 17 U/L (ref 14–54)
AST: 24 U/L (ref 15–41)
Albumin: 3.6 g/dL (ref 3.5–5.0)
Anion gap: 11 (ref 5–15)
BUN: 17 mg/dL (ref 6–20)
CHLORIDE: 104 mmol/L (ref 101–111)
CO2: 25 mmol/L (ref 22–32)
CREATININE: 0.78 mg/dL (ref 0.44–1.00)
Calcium: 9.6 mg/dL (ref 8.9–10.3)
GFR calc Af Amer: >60 mL/min (ref >60)
GFR calc non Af Amer: >60 mL/min (ref >60)
Glucose, Bld: 152 mg/dL — ABNORMAL HIGH (ref 65–99)
Potassium: 3.8 mmol/L (ref 3.5–5.1)
Sodium: 140 mmol/L (ref 135–145)
Total Bilirubin: 0.4 mg/dL (ref 0.3–1.2)
Total Protein: 7.2 g/dL (ref 6.5–8.1)

## 2017-07-13 LAB — URINALYSIS, COMPLETE (UACMP) WITH MICROSCOPIC
Bacteria, UA: NONE SEEN
Bilirubin Urine: NEGATIVE
Glucose, UA: NEGATIVE mg/dL
Hgb urine dipstick: NEGATIVE
KETONES UR: NEGATIVE mg/dL
Leukocytes, UA: NEGATIVE
Nitrite: NEGATIVE
PH: 6 (ref 5.0–8.0)
Protein, ur: NEGATIVE mg/dL
RBC / HPF: NONE SEEN RBC/hpf (ref 0–5)
SPECIFIC GRAVITY, URINE: 1.029 (ref 1.005–1.030)

## 2017-07-13 LAB — CHLAMYDIA/NGC RT PCR (ARMC ONLY)
Chlamydia Tr: NOT DETECTED
N GONORRHOEAE: NOT DETECTED

## 2017-07-13 LAB — HCG, QUANTITATIVE, PREGNANCY: hCG, Beta Chain, Quant, S: <1 m[IU]/mL (ref <5)

## 2017-07-13 LAB — POCT PREGNANCY, URINE: PREG TEST UR: NEGATIVE

## 2017-07-13 NOTE — ED Notes (Signed)
Patient presents to the ED because her period is approx. 3 weeks late.  Patient states, "Last time I was this late, I was pregnant."  Patient states, "I keep feeling like it's going to start but it's just not starting."  Patient reports multiple negative pregnancy tests at home.  Patient denies abdominal pain.  Patient is in no obvious distress at this time.

## 2017-07-13 NOTE — ED Notes (Signed)

## 2017-07-13 NOTE — ED Notes (Signed)
Lab notified to add on blood that is already down there.

## 2017-07-13 NOTE — ED Provider Notes (Signed)
[facility] Emergency Department Provider Note  ____________________________________________  Time seen: Approximately 3:42 PM  I have reviewed the triage vital signs and the nursing notes.   HISTORY  Chief Complaint No chief complaint on file.    HPI Danielle Mcdaniel is a 26 y.o. female that presents to the emergency department for evaluation of late menstrual perid. She states that last period was July 15. She is currently breast-feeding her 17-month-old but has had regular period until now. She has had some lower abdominal cramping within the last week. It fells like when she normally has a period. She has taken several pregnancy tests, which have all been negative. She denies shortness breath, chest pain, nausea, vomiting, back pain, dysuria, urgency, frequency, vaginal discharge.    Past Medical History:  Diagnosis Date  . Diabetes mellitus without complication (HCC)     There are no active problems to display for this patient.   Past Surgical History:  Procedure Laterality Date  . CESAREAN SECTION      Prior to Admission medications   Not on File    Allergies Penicillins  History reviewed. No pertinent family history.  Social History Social History  Substance Use Topics  . Smoking status: Never Smoker  . Smokeless tobacco: Never Used  . Alcohol use Yes     Review of Systems  Constitutional: No fever/chills Cardiovascular: No chest pain. Respiratory: No SOB. Gastrointestinal: No nausea, no vomiting.  Musculoskeletal: Negative for musculoskeletal pain. Skin: Negative for rash, abrasions, lacerations, ecchymosis. Neurological: Negative for headaches, numbness or tingling   ____________________________________________   PHYSICAL EXAM:  VITAL SIGNS: ED Triage Vitals  Enc Vitals Group     BP 07/13/17 1210 124/61     Pulse Rate 07/13/17 1210 77     Resp 07/13/17 1210 18     Temp 07/13/17 1210 99 F (37.2 C)     Temp Source  07/13/17 1210 Oral     SpO2 07/13/17 1210 95 %     Weight 07/13/17 1211 250 lb (113.4 kg)     Height 07/13/17 1211  (1.676 m)     Head Circumference --      Peak Flow --      Pain Score --      Pain Loc --      Pain Edu? --      Excl. in GC? --      Constitutional: Alert and oriented. Well appearing and in no acute distress. Eyes: Conjunctivae are normal. PERRL. EOMI. Head: Atraumatic. ENT:      Ears:      Nose: No congestion/rhinnorhea.      Mouth/Throat: Mucous membranes are moist.  Neck: No stridor.  Cardiovascular: Normal rate, regular rhythm.  Good peripheral circulation. Respiratory: Normal respiratory effort without tachypnea or retractions. Lungs CTAB. Good air entry to the bases with no decreased or absent breath sounds. Gastrointestinal: Bowel sounds 4 quadrants. Mild suprapubic tenderness to palpation. No guarding or rigidity. No palpable masses. No distention. Musculoskeletal: Full range of motion to all extremities. No gross deformities appreciated. Neurologic:  Normal speech and language. No gross focal neurologic deficits are appreciated.  Skin:  Skin is warm, dry and intact. No rash noted.   ____________________________________________   LABS (all labs ordered are listed, but only abnormal results are displayed)  Labs Reviewed  WET PREP, GENITAL - Abnormal; Notable for the following:       Result Value   WBC, Wet Prep HPF POC MANY (*)  All other components within normal limits  COMPREHENSIVE METABOLIC PANEL - Abnormal; Notable for the following:    Glucose, Bld 152 (*)    All other components within normal limits  URINALYSIS, COMPLETE (UACMP) WITH MICROSCOPIC - Abnormal; Notable for the following:    Color, Urine YELLOW (*)    APPearance TURBID (*)    Squamous Epithelial / LPF 0-5 (*)    All other components within normal limits  CHLAMYDIA/NGC RT PCR ([facility] ONLY)  HCG, QUANTITATIVE, PREGNANCY  CBC  POC URINE PREG, ED  POCT PREGNANCY,  URINE   ____________________________________________  EKG   ____________________________________________  RADIOLOGY  [place] Pelvic Complete With Transvaginal  Result Date: 07/13/2017 CLINICAL DATA:  Pelvic cramping. EXAM: TRANSABDOMINAL AND TRANSVAGINAL ULTRASOUND OF PELVIS TECHNIQUE: Both transabdominal and transvaginal ultrasound examinations of the pelvis were performed. Transabdominal technique was performed for global imaging of the pelvis including uterus, ovaries, adnexal regions, and pelvic cul-de-sac. It was necessary to proceed with endovaginal exam following the transabdominal exam to visualize the endometrium and ovaries. COMPARISON:  None FINDINGS: Uterus Measurements: 8.6 x 4.4 x 5.3 cm. No fibroids or other mass visualized. Endometrium Thickness: 8.5 mm.  No focal abnormality visualized. Right ovary Measurements: 3.8 x 2.5 x 2.7 cm. Normal appearance/no adnexal mass. Left ovary Measurements: 3.9 x 3.1 x 3 cm. Normal appearance/no adnexal mass. Other findings Small amount of pelvic free fluid likely physiologic. IMPRESSION: 1. Normal pelvic ultrasound. Electronically Signed   By: Elige KoHetal  Patel   On: 07/13/2017 16:42    ____________________________________________    PROCEDURES  Procedure(s) performed:    Procedures    Medications - No data to display   ____________________________________________   INITIAL IMPRESSION / ASSESSMENT AND PLAN / ED COURSE  Pertinent labs & imaging results that were available during my care of the patient were reviewed by me and considered in my medical decision making (see chart for details).  Review of the [place] CSRS was performed in accordance of the [facility] prior to dispensing any controlled drugs.     Patient presented to emergency department for evaluation of late menstrual period and mild lower abdominal cramping. Vital signs, labwork, and exam are reassuring. Wet prep shows white blood cells. Gonorrhea and chlamydia negative.  Urinalysis negative for UTI.  Pelvic ultrasound negative for acute abnormality. We discussed treating patient for the white blood cells seen on wet prep but decided to hold off since she is breast-feeding and she will follow up with primary care this week. Patient is given ED precautions to return to the ED for any worsening or new symptoms.     ____________________________________________  FINAL CLINICAL IMPRESSION(S) / ED DIAGNOSES  Final diagnoses:  Acute vaginitis  Pregnancy test negative      NEW MEDICATIONS STARTED DURING THIS VISIT:  There are no discharge medications for this patient.       This chart was dictated using voice recognition software/Dragon. Despite best efforts to proofread, errors can occur which can change the meaning. Any change was purely unintentional.    Enid DerryWagner, Chaun Uemura, PA-C 07/13/17 Tillie [id number]    Quale, Mark, MD 07/19/17 2157

## 2017-07-13 NOTE — ED Triage Notes (Signed)
Pt presents to ED c/o missed period for august. LMP 05/18/17. Currently breastfeeding 46mo. States she is here just to find out if she is pregnant. Home pregnancy tests have been negative.

## 2017-07-28 ENCOUNTER — Encounter: Payer: Self-pay | Admitting: Emergency Medicine

## 2017-07-28 ENCOUNTER — Emergency Department
Admission: EM | Admit: 2017-07-28 | Discharge: 2017-07-28 | Disposition: A | Discharge status: Home / Self Care | Payer: Self-pay | Attending: Emergency Medicine | Admitting: Emergency Medicine

## 2017-07-28 DIAGNOSIS — L237 Allergic contact dermatitis due to plants, except food: Secondary | ICD-10-CM | POA: Insufficient documentation

## 2017-07-28 DIAGNOSIS — E119 Type 2 diabetes mellitus without complications: Secondary | ICD-10-CM | POA: Insufficient documentation

## 2017-07-28 MED ORDER — PREDNISONE 10 MG (21) PO TBPK: ORAL_TABLET | ORAL | 0 refills | Status: DC

## 2017-07-28 NOTE — ED Notes (Signed)
See triage note  States she developed hives/rash yesterday  Was seen at [facility] and given an Epi Pen shot tor left upper leg  States her leg is extremely painful and very tender to touch   No redness noted   She conts to have rash to back and face

## 2017-07-28 NOTE — ED Triage Notes (Signed)
States was just seen at [facility] for allergic reaction. Given epi pen L thigh. Here for pain at injection site.

## 2017-07-28 NOTE — ED Provider Notes (Signed)
[facility] Emergency Department Provider Note   ____________________________________________   I have reviewed the triage vital signs and the nursing notes.   HISTORY  Chief Complaint Leg Pain    HPI Danielle Mcdaniel is a 26 y.o. female presented with a rash that developed 2 days ago after sleeping on a couch at her boyfriend's house. She describes the rash as painful, itching and swollen. Patient was seen at [facility] emergency Department earlier this morning where she received an EpiPen injection of 0.3 mg and a 40 mg dose of prednisone for the rash. Patient was told she was experiencing an allergic reaction and received the above treatment. Patient was discharged with hydroxyzine and a cortisone cream. Patient came to the emergency department due to pain related to EpiPen injection in the lateral left thigh and persistent itching and pain related to the rash. Patient reported they evaluated her for scabies and bedbugs which was ruled out during their evaluation. She stated no one else in the home had a similar rash and she was unaware if the home had scabies or bedbugs. Patient denied shortness of breath, difficulty breathing, swelling around the lips mouth or throat. Patient denied any environmental or medications that caused anaphylactic reaction. Patient denies fever, chills, headache, vision changes, chest pain, chest tightness, shortness of breath, abdominal pain, nausea and vomiting.  Past Medical History:  Diagnosis Date  . Diabetes mellitus without complication (HCC)     There are no active problems to display for this patient.   Past Surgical History:  Procedure Laterality Date  . CESAREAN SECTION      Prior to Admission medications   Medication Sig Start Date End Date Taking? Authorizing Provider  predniSONE (STERAPRED UNI-PAK 21 TAB) 10 MG (21) TBPK tablet Take 6 tablets on day 1. Take 5 tablets on day 2. Take 4 tablets on day 3. Take 3 tablets on  day 4. Take 2 tablets on day 5. Take 1 tablets on day 6. 07/28/17   Wirt Hemmerich M, PA-C    Allergies Penicillins  No family history on file.  Social History Social History  Substance Use Topics  . Smoking status: Never Smoker  . Smokeless tobacco: Never Used  . Alcohol use Yes    Review of Systems Constitutional: Negative for fever/chills Eyes: No visual changes. ENT:  Negative for sore throat and for difficulty swallowing Cardiovascular: Denies chest pain. Respiratory: Denies cough. Denies shortness of breath. Gastrointestinal: No abdominal pain.  No nausea, vomiting, diarrhea. Genitourinary: Negative for dysuria. Musculoskeletal: Negative for back pain. Skin: Positive for rash along upper back, lateral upper extremities, right shoulder and the lateral lower legs. Neurological: Negative for headaches.  Negative focal weakness or numbness. Negative for loss of consciousness. Able to ambulate. ____________________________________________   PHYSICAL EXAM:  VITAL SIGNS: ED Triage Vitals  Enc Vitals Group     BP 07/28/17 1356 133/74     Pulse Rate 07/28/17 1356 80     Resp 07/28/17 1356 16     Temp 07/28/17 1356 98.7 F (37.1 C)     Temp Source 07/28/17 1356 Oral     SpO2 07/28/17 1356 99 %     Weight 07/28/17 1356 250 lb (113.4 kg)     Height 07/28/17 1356  (1.676 m)     Head Circumference --      Peak Flow --      Pain Score 07/28/17 1401 7     Pain Loc --  Pain Edu? --      Excl. in GC? --     Constitutional: Alert and oriented. Well appearing and in no acute distress.  Eyes: Conjunctivae are normal. PERRL. EOMI  Head: Normocephalic and atraumatic. ENT:      Ears: Canals clear. TMs intact bilaterally.      Nose: No congestion/rhinnorhea.      Mouth/Throat: Mucous membranes are moist. Oropharynx Normal and non-erythematous or edematous. Neck:Supple. No thyromegaly. No stridor.  Cardiovascular: Normal rate, regular rhythm. Normal S1 and S2.  Good  peripheral circulation. Respiratory: Normal respiratory effort without tachypnea or retractions. Lungs CTAB. No wheezes/rales/rhonchi. Good air entry to the bases with no decreased or absent breath sounds. Hematological/Lymphatic/Immunological: No cervical lymphadenopathy. Cardiovascular: Normal rate, regular rhythm. Normal distal pulses. Gastrointestinal: Bowel sounds 4 quadrants. Soft and nontender to palpation. No guarding or rigidity. No palpable masses. No distention. No CVA tenderness. Musculoskeletal: Nontender with normal range of motion in all extremities. Neurologic: Normal speech and language.  Skin:  Skin is warm, dry and intact. Rash is circumscribed, raised, erythematous papules on upper back, particularly right shoulder, lower back, both arms, both legs and feet, forehead consistent with contact dermatitis; Palms and soles are spared Psychiatric: Mood and affect are normal. Speech and behavior are normal. Patient exhibits appropriate insight and judgement.  ____________________________________________   LABS (all labs ordered are listed, but only abnormal results are displayed)  Labs Reviewed - No data to display ____________________________________________  EKG none ____________________________________________  RADIOLOGY none ____________________________________________   PROCEDURES  Procedure(s) performed: none    Critical Care performed: no ____________________________________________   INITIAL IMPRESSION / ASSESSMENT AND PLAN / ED COURSE  Pertinent labs & imaging results that were available during my care of the patient were reviewed by me and considered in my medical decision making (see chart for details).  Patient presents to emergency department with upper back, particularly right shoulder, lower back, both arms, both legs and feet and forehead  History, physical exam findings are reassuring symptoms are consistent with likely allergic dermatitis due  to poison ivy or throat.  Patient will be prescribed a prednisone taper and advised to continue hydroxyzine as needed as well as cortisone cream. Patient advised to follow up with PCP as needed or return to the emergency department if symptoms return or worsen. Patient informed of clinical course, understand medical decision-making process, and agree with plan.      ____________________________________________   FINAL CLINICAL IMPRESSION(S) / ED DIAGNOSES  Final diagnoses:  Allergic dermatitis due to poison ivy       NEW MEDICATIONS STARTED DURING THIS VISIT:  Discharge Medication List as of 07/28/2017  3:50 PM    START taking these medications   Details  predniSONE (STERAPRED UNI-PAK 21 TAB) 10 MG (21) TBPK tablet Take 6 tablets on day 1. Take 5 tablets on day 2. Take 4 tablets on day 3. Take 3 tablets on day 4. Take 2 tablets on day 5. Take 1 tablets on day 6., Print         Note:  This document was prepared using Dragon voice recognition software and may include unintentional dictation errors.    Caiden Arteaga, Karl Pock 07/28/17 Windell Moment    Minna Antis, MD 07/29/17 2015

## 2017-07-28 NOTE — Discharge Instructions (Signed)
Take medication as prescribed. Return to emergency department if symptoms worsen and follow-up with PCP as needed.   °

## 2017-08-11 ENCOUNTER — Emergency Department
Admission: EM | Admit: 2017-08-11 | Discharge: 2017-08-11 | Disposition: A | Discharge status: Home / Self Care | Payer: Self-pay | Attending: Emergency Medicine | Admitting: Emergency Medicine

## 2017-08-11 ENCOUNTER — Emergency Department: Payer: Self-pay

## 2017-08-11 ENCOUNTER — Encounter: Payer: Self-pay | Admitting: *Deleted

## 2017-08-11 DIAGNOSIS — Y92410 Unspecified street and highway as the place of occurrence of the external cause: Secondary | ICD-10-CM | POA: Insufficient documentation

## 2017-08-11 DIAGNOSIS — Y9389 Activity, other specified: Secondary | ICD-10-CM | POA: Insufficient documentation

## 2017-08-11 DIAGNOSIS — Y999 Unspecified external cause status: Secondary | ICD-10-CM | POA: Insufficient documentation

## 2017-08-11 DIAGNOSIS — M7918 Myalgia, other site: Secondary | ICD-10-CM

## 2017-08-11 DIAGNOSIS — M791 Myalgia, unspecified site: Secondary | ICD-10-CM | POA: Insufficient documentation

## 2017-08-11 DIAGNOSIS — E119 Type 2 diabetes mellitus without complications: Secondary | ICD-10-CM | POA: Insufficient documentation

## 2017-08-11 LAB — POCT PREGNANCY, URINE: Preg Test, Ur: NEGATIVE

## 2017-08-11 MED ORDER — NAPROXEN 500 MG PO TABS
500.0000 mg | ORAL_TABLET | Freq: Once | ORAL | Status: AC
  Administered 2017-08-11: 500 mg via ORAL
  Filled 2017-08-11: qty 1

## 2017-08-11 MED ORDER — CYCLOBENZAPRINE HCL 10 MG PO TABS
10.0000 mg | ORAL_TABLET | Freq: Once | ORAL | Status: AC
  Administered 2017-08-11: 10 mg via ORAL
  Filled 2017-08-11: qty 1

## 2017-08-11 MED ORDER — NABUMETONE 750 MG PO TABS: 750.0000 mg | ORAL_TABLET | Freq: Two times a day (BID) | ORAL | 0 refills | Status: DC

## 2017-08-11 MED ORDER — CYCLOBENZAPRINE HCL 5 MG PO TABS: 5.0000 mg | ORAL_TABLET | Freq: Three times a day (TID) | ORAL | 0 refills | Status: DC | PRN
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
## 2017-08-11 NOTE — ED Notes (Signed)
Pt discharged to home.  Family member driving.  Discharge instructions reviewed.  Verbalized understanding.  No questions or concerns at this time.  Teach back verified.  Pt in NAD.  No items left in ED.   
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
## 2017-08-11 NOTE — ED Notes (Signed)
poc pregnancy negative 
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
## 2018-05-11 DIAGNOSIS — O09893 Supervision of other high risk pregnancies, third trimester: Secondary | ICD-10-CM | POA: Insufficient documentation

## 2018-05-15 ENCOUNTER — Emergency Department
Admission: EM | Admit: 2018-05-15 | Discharge: 2018-05-15 | Disposition: A | Discharge status: Home / Self Care | Payer: BC Managed Care – PPO | Attending: Emergency Medicine | Admitting: Emergency Medicine

## 2018-05-15 ENCOUNTER — Other Ambulatory Visit: Payer: Self-pay

## 2018-05-15 ENCOUNTER — Encounter: Payer: Self-pay | Admitting: Emergency Medicine

## 2018-05-15 DIAGNOSIS — O9989 Other specified diseases and conditions complicating pregnancy, childbirth and the puerperium: Secondary | ICD-10-CM | POA: Diagnosis present

## 2018-05-15 DIAGNOSIS — Z3A01 Less than 8 weeks gestation of pregnancy: Secondary | ICD-10-CM | POA: Diagnosis not present

## 2018-05-15 DIAGNOSIS — O99810 Abnormal glucose complicating pregnancy: Secondary | ICD-10-CM | POA: Diagnosis not present

## 2018-05-15 DIAGNOSIS — Z79899 Other long term (current) drug therapy: Secondary | ICD-10-CM | POA: Insufficient documentation

## 2018-05-15 DIAGNOSIS — O210 Mild hyperemesis gravidarum: Secondary | ICD-10-CM | POA: Diagnosis not present

## 2018-05-15 LAB — COMPREHENSIVE METABOLIC PANEL
ALBUMIN: 4 g/dL (ref 3.5–5.0)
ALT: 24 U/L (ref 0–44)
ANION GAP: 9 (ref 5–15)
AST: 24 U/L (ref 15–41)
Alkaline Phosphatase: 45 U/L (ref 38–126)
BILIRUBIN TOTAL: 0.7 mg/dL (ref 0.3–1.2)
BUN: 8 mg/dL (ref 6–20)
CO2: 23 mmol/L (ref 22–32)
Calcium: 9.2 mg/dL (ref 8.9–10.3)
Chloride: 105 mmol/L (ref 98–111)
Creatinine, Ser: 0.8 mg/dL (ref 0.44–1.00)
GFR calc Af Amer: >60 mL/min (ref >60)
GFR calc non Af Amer: >60 mL/min (ref >60)
GLUCOSE: 100 mg/dL — AB (ref 70–99)
POTASSIUM: 3.6 mmol/L (ref 3.5–5.1)
SODIUM: 137 mmol/L (ref 135–145)
TOTAL PROTEIN: 7.5 g/dL (ref 6.5–8.1)

## 2018-05-15 LAB — CBC
HEMATOCRIT: 37.3 % (ref 35.0–47.0)
HEMOGLOBIN: 13 g/dL (ref 12.0–16.0)
MCH: 28.2 pg (ref 26.0–34.0)
MCHC: 34.7 g/dL (ref 32.0–36.0)
MCV: 81.1 fL (ref 80.0–100.0)
Platelets: 342 10*3/uL (ref 150–440)
RBC: 4.6 MIL/uL (ref 3.80–5.20)
RDW: 14.2 % (ref 11.5–14.5)
WBC: 6.7 10*3/uL (ref 3.6–11.0)

## 2018-05-15 LAB — LIPASE, BLOOD: Lipase: 29 U/L (ref 11–51)

## 2018-05-15 MED ORDER — DOXYLAMINE-PYRIDOXINE 10-10 MG PO TBEC: DELAYED_RELEASE_TABLET | ORAL | 1 refills | Status: DC

## 2018-05-15 MED ORDER — ONDANSETRON HCL 4 MG/2ML IJ SOLN
4.0000 mg | Freq: Once | INTRAMUSCULAR | Status: AC
  Administered 2018-05-15: 4 mg via INTRAVENOUS
  Filled 2018-05-15: qty 2

## 2018-05-15 MED ORDER — SODIUM CHLORIDE 0.9 % IV SOLN
1000.0000 mL | Freq: Once | INTRAVENOUS | Status: AC
  Administered 2018-05-15: 1000 mL via INTRAVENOUS

## 2018-05-15 NOTE — ED Notes (Signed)
Pt tolerated gingerale. PT is A/O x4

## 2018-05-15 NOTE — ED Notes (Signed)
Pt aware of the need for urine. RN will continue to monitor.

## 2018-05-15 NOTE — ED Triage Notes (Signed)
Arrives with C/O nausea and emesis x 1-2 weeks.  Patient states she is [redacted] weeks pregnant and is being cared for by [facility].  EDC:  12/31/2018.  G3 P2.  AAOx3.  Skin warm and dry.NAD

## 2018-05-15 NOTE — ED Notes (Signed)
Pt ambulatory upon discharge; declined wheel chair. Verbalized understanding of discharge instructions, follow-up care and prescription. VSS. Skin warm and dry. A&O x4.  

## 2018-05-15 NOTE — ED Triage Notes (Signed)
Says was sent here by dr for dehydration.  Has been vomiting for a week.  Is 7-[redacted] week pregnant.

## 2018-05-15 NOTE — ED Provider Notes (Signed)
[facility] Emergency Department Provider Note   ____________________________________________    I have reviewed the triage vital signs and the nursing notes.   HISTORY  Chief Complaint Nausea and Emesis During Pregnancy     HPI Danielle Mcdaniel is a 27 y.o. female who presents with complaints of nausea and vomiting.  Patient reports she is approximately [redacted] weeks pregnant and has been having significant nausea and vomiting and inability to tolerate p.o.'s.  Saw her OB earlier this week and started on Phenergan with no improvement.  Denies fevers or chills.  No abdominal pain.  No vaginal bleeding.  G2 P1   Past Medical History:  Diagnosis Date  . Diabetes mellitus without complication (HCC)     There are no active problems to display for this patient.   Past Surgical History:  Procedure Laterality Date  . CESAREAN SECTION      Prior to Admission medications   Medication Sig Start Date End Date Taking? Authorizing Provider  prenatal vitamin w/FE, FA (PRENATAL 1 + 1) 27-1 MG TABS tablet Take 1 tablet by mouth daily at 12 noon.   Yes [provider]  promethazine (PHENERGAN) 25 MG tablet Take 25 mg by mouth every 6 (six) hours as needed. for nausea 05/11/18  Yes [provider]  Doxylamine-Pyridoxine (DICLEGIS) 10-10 MG TBEC Two tablets at bedtime on day 1 and 2; if symptoms persist, take 1 tablet in morning and 2 tablets at bedtime on day 3; if symptoms persist, may increase to 1 tablet in morning, 1 tablet mid-afternoon, and 2 tablets at bedtime on day 4 for a maximum of 4 tablets per day. Use the minimum dose necessary to control your symptoms. 05/15/18   Loleta RoseForbach, Cory, MD     Allergies Penicillins  No family history on file.  Social History Social History   Tobacco Use  . Smoking status: Never Smoker  . Smokeless tobacco: Never Used  Substance Use Topics  . Alcohol use: Yes    Comment: occ  . Drug use: No    Review of  Systems  Constitutional: No fever/chills Eyes: No visual changes.  ENT: Mild sore throat from vomiting Cardiovascular: Denies chest pain. Respiratory: No cough Gastrointestinal: As above.   Genitourinary: Negative for dysuria. Musculoskeletal: Negative for back pain. Skin: Negative for rash. Neurological: Negative for headaches   ____________________________________________   PHYSICAL EXAM:  VITAL SIGNS: ED Triage Vitals  Enc Vitals Group     BP 05/15/18 1220 108/60     Pulse Rate 05/15/18 1220 69     Resp 05/15/18 1220 16     Temp 05/15/18 1220 98.5 F (36.9 C)     Temp Source 05/15/18 1220 Oral     SpO2 05/15/18 1220 98 %     Weight 05/15/18 1219 117.5 kg (259 lb)     Height 05/15/18 1219 1.702 m (5\' 7" )     Head Circumference --      Peak Flow --      Pain Score 05/15/18 1219 0     Pain Loc --      Pain Edu? --      Excl. in GC? --     Constitutional: Alert and oriented. No acute distress. Pleasant and interactive Eyes: Conjunctivae are normal.   Nose: No congestion/rhinnorhea. Mouth/Throat: Mucous membranes are moist.    Cardiovascular: Normal rate, regular rhythm. Grossly normal heart sounds.  Good peripheral circulation. Respiratory: Normal respiratory effort.  No retractions. Lungs CTAB. Gastrointestinal:  Soft and nontender. No distention.   Musculoskeletal: No lower extremity tenderness nor edema.  Warm and well perfused Neurologic:  Normal speech and language. No gross focal neurologic deficits are appreciated.  Skin:  Skin is warm, dry and intact. No rash noted. Psychiatric: Mood and affect are normal. Speech and behavior are normal.  ____________________________________________   LABS (all labs ordered are listed, but only abnormal results are displayed)  Labs Reviewed  COMPREHENSIVE METABOLIC PANEL - Abnormal; Notable for the following components:      Result Value   Glucose, Bld 100 (*)    All other components within normal limits  LIPASE,  BLOOD  CBC  URINALYSIS, COMPLETE (UACMP) WITH MICROSCOPIC   ____________________________________________  EKG  None ____________________________________________  RADIOLOGY   None ____________________________________________   PROCEDURES  Procedure(s) performed: No  Procedures   Critical Care performed: No ____________________________________________   INITIAL IMPRESSION / ASSESSMENT AND PLAN / ED COURSE  Pertinent labs & imaging results that were available during my care of the patient were reviewed by me and considered in my medical decision making (see chart for details).  Patient presents with nausea and vomiting/morning sickness.  No abdominal pain, no vaginal bleeding.  Vital signs are reassuring.  Lab work is unremarkable.  Treated with IV fluids and IV Zofran    ____________________________________________   FINAL CLINICAL IMPRESSION(S) / ED DIAGNOSES  Final diagnoses:  Morning sickness        Note:  This document was prepared using Dragon voice recognition software and may include unintentional dictation errors.    Jene Every, MD 05/15/18 1524

## 2018-05-28 ENCOUNTER — Other Ambulatory Visit: Payer: Self-pay

## 2018-05-28 ENCOUNTER — Observation Stay
Admission: EM | Admit: 2018-05-28 | Discharge: 2018-05-30 | Disposition: A | Discharge status: Home / Self Care | Payer: BC Managed Care – PPO | Attending: Obstetrics and Gynecology | Admitting: Obstetrics and Gynecology

## 2018-05-28 ENCOUNTER — Encounter: Payer: Self-pay | Admitting: Emergency Medicine

## 2018-05-28 ENCOUNTER — Other Ambulatory Visit: Payer: Self-pay | Admitting: Obstetrics and Gynecology

## 2018-05-28 DIAGNOSIS — Z3A09 9 weeks gestation of pregnancy: Secondary | ICD-10-CM | POA: Insufficient documentation

## 2018-05-28 DIAGNOSIS — O24911 Unspecified diabetes mellitus in pregnancy, first trimester: Secondary | ICD-10-CM | POA: Diagnosis not present

## 2018-05-28 DIAGNOSIS — O210 Mild hyperemesis gravidarum: Principal | ICD-10-CM | POA: Insufficient documentation

## 2018-05-28 DIAGNOSIS — Z79899 Other long term (current) drug therapy: Secondary | ICD-10-CM | POA: Diagnosis not present

## 2018-05-28 DIAGNOSIS — O219 Vomiting of pregnancy, unspecified: Secondary | ICD-10-CM

## 2018-05-28 DIAGNOSIS — R1010 Upper abdominal pain, unspecified: Secondary | ICD-10-CM

## 2018-05-28 LAB — COMPREHENSIVE METABOLIC PANEL
ALBUMIN: 4.2 g/dL (ref 3.5–5.0)
ALK PHOS: 50 U/L (ref 38–126)
ALT: 55 U/L — ABNORMAL HIGH (ref 0–44)
ANION GAP: 14 (ref 5–15)
AST: 25 U/L (ref 15–41)
BUN: 10 mg/dL (ref 6–20)
CALCIUM: 10 mg/dL (ref 8.9–10.3)
CO2: 15 mmol/L — AB (ref 22–32)
Chloride: 107 mmol/L (ref 98–111)
Creatinine, Ser: 0.59 mg/dL (ref 0.44–1.00)
GFR calc Af Amer: >60 mL/min (ref >60)
GFR calc non Af Amer: >60 mL/min (ref >60)
GLUCOSE: 89 mg/dL (ref 70–99)
POTASSIUM: 3.9 mmol/L (ref 3.5–5.1)
SODIUM: 136 mmol/L (ref 135–145)
Total Bilirubin: 1.1 mg/dL (ref 0.3–1.2)
Total Protein: 8.1 g/dL (ref 6.5–8.1)

## 2018-05-28 LAB — CBC
HEMATOCRIT: 42 % (ref 35.0–47.0)
HEMOGLOBIN: 14.3 g/dL (ref 12.0–16.0)
MCH: 28.4 pg (ref 26.0–34.0)
MCHC: 34.1 g/dL (ref 32.0–36.0)
MCV: 83.3 fL (ref 80.0–100.0)
Platelets: 324 10*3/uL (ref 150–440)
RBC: 5.04 MIL/uL (ref 3.80–5.20)
RDW: 14.4 % (ref 11.5–14.5)
WBC: 9.7 10*3/uL (ref 3.6–11.0)

## 2018-05-28 LAB — URINALYSIS, COMPLETE (UACMP) WITH MICROSCOPIC
Bacteria, UA: NONE SEEN
Bilirubin Urine: NEGATIVE
Glucose, UA: NEGATIVE mg/dL
Ketones, ur: 80 mg/dL — AB
Leukocytes, UA: NEGATIVE
Nitrite: NEGATIVE
PROTEIN: 100 mg/dL — AB
Specific Gravity, Urine: 1.031 — ABNORMAL HIGH (ref 1.005–1.030)
pH: 5 (ref 5.0–8.0)

## 2018-05-28 LAB — HCG, QUANTITATIVE, PREGNANCY: hCG, Beta Chain, Quant, S: 259704 m[IU]/mL — ABNORMAL HIGH (ref <5)

## 2018-05-28 LAB — LIPASE, BLOOD: Lipase: 51 U/L (ref 11–51)

## 2018-05-28 MED ORDER — ONDANSETRON HCL 4 MG/2ML IJ SOLN
4.0000 mg | Freq: Once | INTRAMUSCULAR | Status: AC
  Administered 2018-05-28: 4 mg via INTRAVENOUS
  Filled 2018-05-28: qty 2

## 2018-05-28 MED ORDER — ONDANSETRON 4 MG PO TBDP
4.0000 mg | ORAL_TABLET | Freq: Four times a day (QID) | ORAL | Status: DC | PRN
  Administered 2018-05-29 – 2018-05-30 (×4): 4 mg via ORAL
  Filled 2018-05-28 (×4): qty 1

## 2018-05-28 MED ORDER — SODIUM CHLORIDE 0.9 % IJ SOLN
INTRAMUSCULAR | Status: AC
  Administered 2018-05-28: 23:00:00
  Filled 2018-05-28: qty 10

## 2018-05-28 MED ORDER — DEXTROSE 5 % IN LACTATED RINGERS IV BOLUS
500.0000 mL | Freq: Once | INTRAVENOUS | Status: AC
  Administered 2018-05-29: 500 mL via INTRAVENOUS

## 2018-05-28 MED ORDER — SODIUM CHLORIDE 0.9 % IV BOLUS
1000.0000 mL | Freq: Once | INTRAVENOUS | Status: AC
  Administered 2018-05-28: 1000 mL via INTRAVENOUS

## 2018-05-28 MED ORDER — PROMETHAZINE HCL 25 MG/ML IJ SOLN
25.0000 mg | Freq: Three times a day (TID) | INTRAMUSCULAR | Status: DC
  Administered 2018-05-28 – 2018-05-29 (×2): 25 mg via INTRAVENOUS
  Filled 2018-05-28 (×2): qty 1

## 2018-05-28 MED ORDER — CALCIUM CARBONATE ANTACID 500 MG PO CHEW: 2.0000 | CHEWABLE_TABLET | ORAL | Status: DC | PRN

## 2018-05-28 MED ORDER — ACETAMINOPHEN 325 MG PO TABS: 650.0000 mg | ORAL_TABLET | ORAL | Status: DC | PRN

## 2018-05-28 MED ORDER — DEXTROSE IN LACTATED RINGERS 5 % IV SOLN
INTRAVENOUS | Status: DC
  Administered 2018-05-29 – 2018-05-30 (×3): via INTRAVENOUS

## 2018-05-28 MED ORDER — ZOLPIDEM TARTRATE 5 MG PO TABS: 5.0000 mg | ORAL_TABLET | Freq: Every evening | ORAL | Status: DC | PRN

## 2018-05-28 MED ORDER — METHYLPREDNISOLONE 4 MG PO TBPK
4.0000 mg | ORAL_TABLET | Freq: Every day | ORAL | Status: DC
  Filled 2018-05-28: qty 21

## 2018-05-28 MED ORDER — SODIUM CHLORIDE 0.9 % IV SOLN
25.0000 mg | Freq: Three times a day (TID) | INTRAVENOUS | Status: DC
  Filled 2018-05-28: qty 1

## 2018-05-28 MED ORDER — METHYLPREDNISOLONE 4 MG PO TBPK
8.0000 mg | ORAL_TABLET | Freq: Every day | ORAL | Status: DC
  Filled 2018-05-28: qty 21

## 2018-05-28 MED ORDER — THIAMINE HCL 100 MG/ML IJ SOLN
100.0000 mg | Freq: Once | INTRAMUSCULAR | Status: AC
  Administered 2018-05-28: 100 mg via INTRAVENOUS
  Filled 2018-05-28: qty 1

## 2018-05-28 MED ORDER — METHYLPREDNISOLONE 4 MG PO TBPK
16.0000 mg | ORAL_TABLET | Freq: Every day | ORAL | Status: DC
  Administered 2018-05-29 – 2018-05-30 (×2): 16 mg via ORAL
  Filled 2018-05-28: qty 21

## 2018-05-28 MED ORDER — DEXTROSE IN LACTATED RINGERS 5 % IV SOLN
Freq: Once | INTRAVENOUS | Status: AC
  Administered 2018-05-28: 23:00:00 via INTRAVENOUS
  Filled 2018-05-28: qty 10

## 2018-05-28 MED ORDER — PRENATAL MULTIVITAMIN CH: 1.0000 | ORAL_TABLET | Freq: Every day | ORAL | Status: DC

## 2018-05-28 MED ORDER — METHYLPREDNISOLONE 4 MG PO TBPK
16.0000 mg | ORAL_TABLET | Freq: Every day | ORAL | Status: AC
  Administered 2018-05-29 – 2018-05-30 (×2): 16 mg via ORAL
  Filled 2018-05-28: qty 21

## 2018-05-28 MED ORDER — METHYLPREDNISOLONE SODIUM SUCC 125 MG IJ SOLR
48.0000 mg | Freq: Once | INTRAMUSCULAR | Status: AC
  Administered 2018-05-28: 48 mg via INTRAVENOUS
  Filled 2018-05-28: qty 2

## 2018-05-28 MED ORDER — METHYLPREDNISOLONE 4 MG PO TBPK
16.0000 mg | ORAL_TABLET | Freq: Every day | ORAL | Status: DC
  Administered 2018-05-29: 16 mg via ORAL
  Filled 2018-05-28: qty 21

## 2018-05-28 MED ORDER — SODIUM CHLORIDE 0.9 % IV BOLUS: 1000.0000 mL | Freq: Once | INTRAVENOUS | Status: DC

## 2018-05-28 MED ORDER — METOCLOPRAMIDE HCL 5 MG/ML IJ SOLN: 10.0000 mg | Freq: Four times a day (QID) | INTRAMUSCULAR | Status: DC | PRN

## 2018-05-28 MED ORDER — DOCUSATE SODIUM 100 MG PO CAPS
100.0000 mg | ORAL_CAPSULE | Freq: Every day | ORAL | Status: DC
  Administered 2018-05-29 – 2018-05-30 (×2): 100 mg via ORAL
  Filled 2018-05-28 (×2): qty 1

## 2018-05-28 NOTE — ED Notes (Signed)
Pt states she is [redacted] weeks pregnant.  Pt has nausea and reports vomiting.  [facility]   [id number]

## 2018-05-28 NOTE — ED Triage Notes (Signed)
Pt presents to ED c/o n/v x3 wks. States [redacted] wks pregnant. Has been taking Zofran ODT without relief. States she thinks she has not had BM in 2 wks. Called OB on call who told her to come to ED.

## 2018-05-28 NOTE — ED Notes (Signed)
Report called to Beth RN

## 2018-05-28 NOTE — ED Provider Notes (Signed)
[facility] Emergency Department Provider Note   ____________________________________________   First MD Initiated Contact with Patient 05/28/18 1932     (approximate)  I have reviewed the triage vital signs and the nursing notes.   HISTORY  Chief Complaint Emesis    HPI Danielle Mcdaniel is a 27 y.o. female who was seen approximately the last week for hyperemesis.  She returns today saying she has not had anything that she can keep down on her stomach for the last 3 weeks.  She has not stooled she does not believe for 2 weeks.  She cannot even keep the Zofran that she has down.  She says her breath smells ketotic and indeed it does.  She reports minimal right-sided abdominal pain.  She is [redacted] weeks pregnant this is her third pregnancy she has had one child who was born prematurely and died and then one child who is living.   Past Medical History:  Diagnosis Date  . Diabetes mellitus without complication (HCC)     There are no active problems to display for this patient.   Past Surgical History:  Procedure Laterality Date  . CESAREAN SECTION      Prior to Admission medications   Medication Sig Start Date End Date Taking? Authorizing Provider  Doxylamine-Pyridoxine (DICLEGIS) 10-10 MG TBEC Two tablets at bedtime on day 1 and 2; if symptoms persist, take 1 tablet in morning and 2 tablets at bedtime on day 3; if symptoms persist, may increase to 1 tablet in morning, 1 tablet mid-afternoon, and 2 tablets at bedtime on day 4 for a maximum of 4 tablets per day. Use the minimum dose necessary to control your symptoms. 05/15/18   Loleta Rose, MD  prenatal vitamin w/FE, FA (PRENATAL 1 + 1) 27-1 MG TABS tablet Take 1 tablet by mouth daily at 12 noon.    [provider]  promethazine (PHENERGAN) 25 MG tablet Take 25 mg by mouth every 6 (six) hours as needed. for nausea 05/11/18   [provider]    Allergies Penicillins  History reviewed. No  pertinent family history.  Social History Social History   Tobacco Use  . Smoking status: Never Smoker  . Smokeless tobacco: Never Used  Substance Use Topics  . Alcohol use: Yes    Comment: occ  . Drug use: No    Review of Systems  Constitutional: No fever/chills Eyes: No visual changes. ENT: No sore throat. Cardiovascular: Denies chest pain. Respiratory: Denies shortness of breath. Gastrointestinal: Interval abdominal pain.   nausea,  vomiting.  No diarrhea.  No constipation. Genitourinary: Negative for dysuria. Musculoskeletal: Negative for back pain. Skin: Negative for rash. Neurological: Negative for headaches, focal weakness  ____________________________________________   PHYSICAL EXAM:  VITAL SIGNS: ED Triage Vitals  Enc Vitals Group     BP 05/28/18 1843 108/62     Pulse Rate 05/28/18 1843 100     Resp 05/28/18 1843 18     Temp 05/28/18 1843 98.3 F (36.8 C)     Temp Source 05/28/18 1843 Oral     SpO2 05/28/18 1843 96 %     Weight 05/28/18 1843 240 lb (108.9 kg)     Height 05/28/18 1843 5\' 7"  (1.702 m)     Head Circumference --      Peak Flow --      Pain Score 05/28/18 1845 5     Pain Loc --      Pain Edu? --  Excl. in GC? --     Constitutional: Alert and oriented.  Smells ketotic is vomiting Eyes: Conjunctivae are normal.  Head: Atraumatic. Nose: No congestion/rhinnorhea. Mouth/Throat: Mucous membranes are moist.  Oropharynx non-erythematous. Neck: No stridor. Cardiovascular: Normal rate, regular rhythm. Grossly normal heart sounds.  Good peripheral circulation. Respiratory: Normal respiratory effort.  No retractions. Lungs CTAB. Gastrointestinal: Soft minimal right-sided tenderness on the level of the umbilicus.. No distention. No abdominal bruits. No CVA tenderness. Rectal: No stool in the vault Musculoskeletal: No lower extremity tenderness nor edema.   Neurologic:  Normal speech and language. No gross focal neurologic deficits are  appreciated. No gait instability. Skin:  Skin is warm, dry and intact. No rash noted. Psychiatric: Mood and affect are normal. Speech and behavior are normal.  ____________________________________________   LABS (all labs ordered are listed, but only abnormal results are displayed)  Labs Reviewed  COMPREHENSIVE METABOLIC PANEL - Abnormal; Notable for the following components:      Result Value   CO2 15 (*)    ALT 55 (*)    All other components within normal limits  LIPASE, BLOOD  CBC  URINALYSIS, COMPLETE (UACMP) WITH MICROSCOPIC  HCG, QUANTITATIVE, PREGNANCY  POC URINE PREG, ED   ____________________________________________  EKG  ____________________________________________  RADIOLOGY  ED MD interpretation:   Official radiology report(s): No results found.  ____________________________________________   PROCEDURES  Procedure(s) performed:   Procedures  Critical Care performed:   ____________________________________________   INITIAL IMPRESSION / ASSESSMENT AND PLAN / ED COURSE Discussed patient with Dr. Dalbert [facility].  She will come and see the patient.       ____________________________________________   FINAL CLINICAL IMPRESSION(S) / ED DIAGNOSES  Final diagnoses:  Hyperemesis affecting pregnancy, antepartum     ED Discharge Orders    None       Note:  This document was prepared using Dragon voice recognition software and may include unintentional dictation errors.    Arnaldo NatalMalinda, Viann Nielson F, MD 05/28/18 2020

## 2018-05-28 NOTE — ED Triage Notes (Signed)
First Nurse Note:  Sent to ED for possible IVF by OB.  Patient of [facility].  States she is [redacted] weeks pregnant and has been vomiting all day.  LMP:  03/20/2018.   G3 P1.  AAOx3.  Skin warm and dry. Ambulates with easy and steady gait.  NAD

## 2018-05-28 NOTE — H&P (Signed)
Consult History and Physical   SERVICE: Gynecology   Patient Name: Danielle Mcdaniel Patient MRN:   [id number]  CC: Hyperemesis  HPI: Danielle Mcdaniel is a 27 y.o. [id number] at 6855w0d EGA presenting to the ER with nausea/vomiting - she has been seen 6 times this month for the same, and has tried an increasing flowchart of meds without success. Today she has been trying to keep a tablespoon of water down each hour but has been vomiting instead. She is also throwing up to po zofran at home.  No dizziness, but decreased urinary frequency.  Review of Systems: positives in bold GEN:   fevers, chills, weight changes, appetite changes, fatigue, night sweats HEENT:  HA, vision changes, hearing loss, congestion, rhinorrhea, sinus pressure, dysphagia CV:   CP, palpitations PULM:  SOB, cough GI:  abd pain, N/V/D/C GU:  dysuria, urgency, frequency MSK:  arthralgias, myalgias, back pain, swelling SKIN:  rashes, color changes, pallor NEURO:  numbness, weakness, tingling, seizures, dizziness, tremors PSYCH:  depression, anxiety, behavioral problems, confusion  HEME/LYMPH:  easy bruising or bleeding ENDO:  heat/cold intolerance  Past Obstetrical History: OB History    Gravida  2   Para      Term      Preterm      AB      Living        SAB      TAB      Ectopic      Multiple      Live Births              Past Gynecologic History: Patient's last menstrual period was 07/25/2017.  Past Medical History: Past Medical History:  Diagnosis Date  . Diabetes mellitus without complication [facility](HCC)     Past Surgical History:   Past Surgical History:  Procedure Laterality Date  . CESAREAN SECTION      Family History:  family history is not on file.  Social History:  Social History   Socioeconomic History  . Marital status: Single    Spouse name: Not on file  . Number of children: Not on file  . Years of education: Not on file  . Highest education level: Not on file   Occupational History  . Not on file  Social Needs  . Financial resource strain: Not on file  . Food insecurity:    Worry: Not on file    Inability: Not on file  . Transportation needs:    Medical: Not on file    Non-medical: Not on file  Tobacco Use  . Smoking status: Never Smoker  . Smokeless tobacco: Never Used  Substance and Sexual Activity  . Alcohol use: Yes    Comment: occ  . Drug use: No  . Sexual activity: Not on file  Lifestyle  . Physical activity:    Days per week: Not on file    Minutes per session: Not on file  . Stress: Not on file  Relationships  . Social connections:    Talks on phone: Not on file    Gets together: Not on file    Attends religious service: Not on file    Active member of club or organization: Not on file    Attends meetings of clubs or organizations: Not on file    Relationship status: Not on file  . Intimate partner violence:    Fear of current or ex partner: Not on file    Emotionally abused: Not on file  Physically abused: Not on file    Forced sexual activity: Not on file  Other Topics Concern  . Not on file  Social History Narrative  . Not on file    Home Medications:  Medications reconciled in EPIC  No current facility-administered medications on file prior to encounter.    Current Outpatient Medications on File Prior to Encounter  Medication Sig Dispense Refill  . Doxylamine-Pyridoxine (DICLEGIS) 10-10 MG TBEC Two tablets at bedtime on day 1 and 2; if symptoms persist, take 1 tablet in morning and 2 tablets at bedtime on day 3; if symptoms persist, may increase to 1 tablet in morning, 1 tablet mid-afternoon, and 2 tablets at bedtime on day 4 for a maximum of 4 tablets per day. Use the minimum dose necessary to control your symptoms. 60 tablet 1  . prenatal vitamin w/FE, FA (PRENATAL 1 + 1) 27-1 MG TABS tablet Take 1 tablet by mouth daily at 12 noon.    . promethazine (PHENERGAN) 25 MG tablet Take 25 mg by mouth every 6  (six) hours as needed. for nausea  0    Allergies:  Allergies  Allergen Reactions  . Penicillins Rash    Physical Exam:  Temp:  [98.3 F (36.8 C)] 98.3 F (36.8 C) (07/25 1843) Pulse Rate:  [100] 100 (07/25 1843) Resp:  [18] 18 (07/25 1843) BP: (108)/(62) 108/62 (07/25 1843) SpO2:  [96 %] 96 % (07/25 1843) Weight:  [108.9 kg (240 lb)] 108.9 kg (240 lb) (07/25 1843)   General Appearance:  Well developed, well nourished, no acute distress, alert and oriented x3 HEENT:  Normocephalic atraumatic, extraocular movements intact, moist mucous membranes Cardiovascular:  Normal S1/S2, regular rate and rhythm, no murmurs Pulmonary:  clear to auscultation, no wheezes, rales or rhonchi, symmetric air entry, good air exchange Abdomen:  Bowel sounds present, soft, nontender, nondistended, no abnormal masses, no epigastric pain Extremities:  Full range of motion, no pedal edema, 2+ distal pulses, no tenderness Skin:  normal coloration and turgor, no rashes, no suspicious skin lesions noted  Neurologic:  Cranial nerves 2-12 grossly intact, normal muscle tone, strength 5/5 all four extremities Psychiatric:  Normal mood and affect, appropriate, no AH/VH Pelvic:  deferred   Labs/Studies:   CBC and Coags:  Lab Results  Component Value Date   WBC 9.7 05/28/2018   NEUTOPHILPCT 66.1 01/28/2014   EOSPCT 1.0 01/28/2014   BASOPCT 0.8 01/28/2014   LYMPHOPCT 24.4 01/28/2014   HGB 14.3 05/28/2018   HCT 42.0 05/28/2018   MCV 83.3 05/28/2018   PLT 324 05/28/2018   CMP:  Lab Results  Component Value Date   NA 136 05/28/2018   K 3.9 05/28/2018   CL 107 05/28/2018   CO2 15 (L) 05/28/2018   BUN 10 05/28/2018   CREATININE 0.59 05/28/2018   CREATININE 0.80 05/15/2018   CREATININE 0.78 07/13/2017   PROT 8.1 05/28/2018   BILITOT 1.1 05/28/2018   ALT 55 (H) 05/28/2018   AST 25 05/28/2018   ALKPHOS 50 05/28/2018    Other Imaging: No results found.   Assessment / Plan:   Danielle Mcdaniel is  a 27 y.o. [id number] who presents with hyperemesis in the first trimester  Medications:  2L LR over 3-5 hours Give 100mg  thiamine with initial LR bolus Give iv MVI with initial bolus, then daily Correct electrolytes if needed IVF of D5LR with 20 mEq of KCL at 124ml/hr: titrate to of urine an hour  8mg  iv zofran q8 hrs 25mg  iv  phenergen q8 hrs NPO   Thank you for the opportunity to be involved with this pt's care.

## 2018-05-29 ENCOUNTER — Ambulatory Visit
Admission: RE | Admit: 2018-05-29 | Discharge: 2018-05-29 | Disposition: A | Discharge status: Home / Self Care | Payer: BC Managed Care – PPO | Source: Ambulatory Visit | Attending: Obstetrics and Gynecology | Admitting: Obstetrics and Gynecology

## 2018-05-29 ENCOUNTER — Observation Stay: Payer: BC Managed Care – PPO

## 2018-05-29 LAB — TYPE AND SCREEN
ABO/RH(D): B POS
Antibody Screen: NEGATIVE

## 2018-05-29 LAB — GLUCOSE, RANDOM: Glucose, Bld: 183 mg/dL — ABNORMAL HIGH (ref 70–99)

## 2018-05-29 MED ORDER — BOOST / RESOURCE BREEZE PO LIQD CUSTOM
1.0000 | Freq: Three times a day (TID) | ORAL | Status: DC
  Administered 2018-05-29 – 2018-05-30 (×4): 1 via ORAL

## 2018-05-29 MED ORDER — PROMETHAZINE HCL 25 MG RE SUPP
25.0000 mg | Freq: Four times a day (QID) | RECTAL | Status: DC | PRN
  Administered 2018-05-29: 25 mg via RECTAL
  Filled 2018-05-29: qty 1

## 2018-05-29 NOTE — Progress Notes (Signed)
Initial Nutrition Assessment  DOCUMENTATION CODES:   Obesity unspecified  INTERVENTION:   Boost Breeze po TID, each supplement provides 250 kcal and 9 grams of protein  MVI daily  NUTRITION DIAGNOSIS:   Inadequate oral intake related to other (see comment)(hyperemesis gravidarum  ) as evidenced by per patient/family report.  GOAL:   Patient will meet greater than or equal to 90% of their needs  MONITOR:   PO intake, Supplement acceptance, Labs, Weight trends, Skin, I & O's  REASON FOR ASSESSMENT:   Other (Comment)([facility] report)    ASSESSMENT:   27 y.o. [id number] at 69w0dEGA presenting to the ER with nausea/vomiting - she has been seen 6 times this month for the same   Met with pt in room today. Pt reports excessive nausea and vomiting for the past 3 weeks; reports she has been unable to keep any food down even crackers. Pt has also not been able to keep any antiemetics down. Pt has not been able to take any prenatal vitamins throughout her pregnancy. Pt reports continued nausea today; reports "it feels like there is vomit in my throat that could come up anytime". Pt has only had ice chips and popcicles today. Pt reports that she has not had a BM for 2 weeks. Pt reports her prepregnancy weight was ~266lbs; pt has lost 26lbs(10%) in one month; this is significant weight loss. Pt getting thiamine and MVI daily intravenously. Pt is willing to try Boost Breeze supplement; this will provide pt with some protein if she is able to keep it down. Can provide Ensure and snacks with diet advancement.   Medications reviewed and include: colace, methylprednisolone, LRS w/ 5% dextrose '@150ml'$ /hr   Labs reviewed: glucose- 183  NUTRITION - FOCUSED PHYSICAL EXAM:    Most Recent Value  Orbital Region  No depletion  Upper Arm Region  No depletion  Thoracic and Lumbar Region  No depletion  Buccal Region  No depletion  Temple Region  No depletion  Clavicle Bone Region  No depletion  Clavicle  and Acromion Bone Region  No depletion  Scapular Bone Region  No depletion  Dorsal Hand  No depletion  Patellar Region  No depletion  Anterior Thigh Region  No depletion  Posterior Calf Region  No depletion  Edema (RD Assessment)  None  Hair  Reviewed  Eyes  Reviewed  Mouth  Reviewed  Skin  Reviewed  Nails  Reviewed     Diet Order:   Diet Order           Diet clear liquid Room service appropriate? Yes; Fluid consistency: Thin  Diet effective now         EDUCATION NEEDS:   Education needs have been addressed  Skin:  Skin Assessment: Reviewed RN Assessment  Last BM:  pta  Height:   Ht Readings from Last 1 Encounters:  05/28/18 '5\' 7"'$  (1.702 m)    Weight:   Wt Readings from Last 1 Encounters:  05/28/18 240 lb (108.9 kg)    Ideal Body Weight:  61.4 kg  BMI:  Body mass index is 37.59 kg/m.  Estimated Nutritional Needs:   Kcal:  2100-2400kcal/day   Protein:  110-130g/day   Fluid:  >2.1L/day   CKoleen DistanceMS, RD, LDN Pager #- [phone]#- [phone] Hours Pager: [phone]

## 2018-05-29 NOTE — Progress Notes (Signed)
DELAYED ENTRY  ANTEPARTUM PROGRESS NOTE  Danielle Mcdaniel is a 27 y.o. G2P0 at 38w1dwho is admitted for hyperemesis gravidarum.   Estimated Date of Delivery: 12/31/18  Length of Stay:  0 Days. Admitted 05/28/2018  Subjective: Danielle Mcdaniel reports no vomiting since admission, though she is feeling a little bit nauseous at the moment.   She reports:  -no leakage of fluid -no vaginal bleeding -no cramping  Vitals:  BP 113/75 (BP Location: Left Arm)   Pulse 71   Temp 98.1 F (36.7 C) (Oral)   Resp 18   Ht 5' 7" (1.702 m)   Wt 108.9 kg (240 lb)   LMP 07/25/2017   SpO2 100%   BMI 37.59 kg/m  Physical Examination: General:   alert, cooperative, appears stated age and no distress  Skin:  normal and no rash or abnormalities  Neurologic:    Alert & oriented x 3  Lungs:   clear to auscultation bilaterally  Heart:   regular rate and rhythm, S1, S2 normal, no murmur, click, rub or gallop  Abdomen:  soft, non-tender; bowel sounds normal; no masses,  no organomegaly  Pelvis:  Exam deferred.  Extremities: : non-tender, symmetric, no edema bilaterally.      Results for orders placed or performed during the hospital encounter of 05/28/18 (from the past 48 hour(s))  Lipase, blood     Status: None   Collection Time: 05/28/18  6:47 PM  Result Value Ref Range   Lipase 51 11 - 51 U/L    Comment: [facility] [address], [place] [address] Comprehensive metabolic panel     Status: Abnormal   Collection Time: 05/28/18  6:47 PM  Result Value Ref Range   Sodium 136 135 - 145 mmol/L   Potassium 3.9 3.5 - 5.1 mmol/L   Chloride 107 98 - 111 mmol/L   CO2 15 (L) 22 - 32 mmol/L   Glucose, Bld 89 70 - 99 mg/dL   BUN 10 6 - 20 mg/dL   Creatinine, Ser 0.59 0.44 - 1.00 mg/dL   Calcium 10.0 8.9 - 10.3 mg/dL   Total Protein 8.1 6.5 - 8.1 g/dL   Albumin 4.2 3.5 - 5.0 g/dL   AST 25 15 - 41 U/L   ALT 55 (H) 0 - 44 U/L   Alkaline Phosphatase 50 38 - 126 U/L   Total  Bilirubin 1.1 0.3 - 1.2 mg/dL   GFR calc non Af Amer >60 >60 mL/min   GFR calc Af Amer >60 >60 mL/min    Comment: (NOTE) The eGFR has been calculated using the CKD EPI equation. This calculation has not been validated in all clinical situations. eGFR's persistently <60 mL/min signify possible Chronic Kidney Disease.    Anion gap 14 5 - 15    Comment: [facility]formed at [facility] [address], [place] [address] CBC     Status: None   Collection Time: 05/28/18  6:47 PM  Result Value Ref Range   WBC 9.7 3.6 - 11.0 K/uL   RBC 5.04 3.80 - 5.20 MIL/uL   Hemoglobin 14.3 12.0 - 16.0 g/dL   HCT 42.0 35.0 - 47.0 %   MCV 83.3 80.0 - 100.0 fL   MCH 28.4 26.0 - 34.0 pg   MCHC 34.1 32.0 - 36.0 g/dL   RDW 14.4 11.5 - 14.5 %   Platelets 324 150 - 440 K/uL    Comment: [facility] at [facility] [address] [place], [place] [address]  hCG, quantitative, pregnancy     Status: Abnormal   Collection Time: 05/28/18  7:50 PM  Result Value Ref Range   hCG, Beta Chain, Quant, S 259,704 (H) <5 mIU/mL    Comment:          GEST. AGE      CONC.  (mIU/mL)   <=1 WEEK        5 - 50     2 WEEKS       50 - 500     3 WEEKS       100 - 10,000     4 WEEKS     1,000 - 30,000     5 WEEKS     3,500 - 115,000   6-8 WEEKS     12,000 - 270,000    12 WEEKS     15,000 - 220,000        FEMALE AND NON-PREGNANT FEMALE:     LESS THAN 5 mIU/mL Performed at [facility], [address] [place], [place] [address]   Urinalysis, Complete w Microscopic     Status: Abnormal   Collection Time: 05/28/18 11:08 PM  Result Value Ref Range   Color, Urine YELLOW (A) YELLOW   APPearance CLEAR (A) CLEAR   Specific Gravity, Urine 1.031 (H) 1.005 - 1.030   pH 5.0 5.0 - 8.0   Glucose, UA NEGATIVE NEGATIVE mg/dL   Hgb urine dipstick SMALL (A) NEGATIVE   Bilirubin Urine NEGATIVE NEGATIVE   Ketones, ur 80 (A) NEGATIVE mg/dL   Protein, ur 100 (A) NEGATIVE mg/dL   Nitrite NEGATIVE NEGATIVE    Leukocytes, UA NEGATIVE NEGATIVE   RBC / HPF 0-5 0 - 5 RBC/hpf   WBC, UA 0-5 0 - 5 WBC/hpf   Bacteria, UA NONE SEEN NONE SEEN   Squamous Epithelial / LPF 0-5 0 - 5   Mucus PRESENT     Comment: Performed at [facility], [address] [place], [place] [address]  Type and screen [place]     Status: None   Collection Time: 05/29/18  5:23 AM  Result Value Ref Range   ABO/RH(D) B POS    Antibody Screen NEG    Sample Expiration      06/01/2018 Performed at [facility], [address] [place], [place] [address]   Glucose, fasting - daily while on taper     Status: Abnormal   Collection Time: 05/29/18  5:23 AM  Result Value Ref Range   Glucose, Bld 183 (H) 70 - 99 mg/dL    Comment: Performed at [facility], [address] [place], [place] [address]    US Abdomen Limited Ruq  Result Date: 05/29/2018 CLINICAL DATA:  Right upper quadrant pain EXAM: ULTRASOUND ABDOMEN LIMITED RIGHT UPPER QUADRANT COMPARISON:  None. FINDINGS: Gallbladder: Gallbladder is well distended with echogenic material within consistent with tumefactive sludge. No definitive calcified stones are seen. No wall thickening is noted. Common bile duct: Diameter: 4.4 mm. Liver: No focal lesion identified. Within normal limits in parenchymal echogenicity. Portal vein is patent on color Doppler imaging with normal direction of blood flow towards the liver. IMPRESSION: Gallbladder sludge.  No other focal abnormality is noted. Electronically Signed   By: Inez Catalina M.D.   On: 05/29/2018 08:13    Current scheduled medications . docusate sodium  100 mg Oral Daily  . feeding supplement  1 Container Oral TID BM  .  methylPREDNISolone  16 mg Oral Q breakfast   Followed by  . [START ON 06/02/2018] methylPREDNISolone  8 mg Oral Q breakfast   Followed by  . [START ON 06/09/2018] methylPREDNISolone  4 mg Oral Q breakfast  . methylPREDNISolone  16 mg Oral Q1400   Followed by  .  [START ON 05/31/2018] methylPREDNISolone  8 mg Oral Q1400   Followed by  . [START ON 06/03/2018] methylPREDNISolone  4 mg Oral Q1400  . methylPREDNISolone  16 mg Oral QHS   Followed by  . [START ON 06/01/2018] methylPREDNISolone  8 mg Oral QHS   Followed by  . [START ON 06/04/2018] methylPREDNISolone  4 mg Oral QHS    I have reviewed the patient's current medications.  ASSESSMENT: Patient Active Problem List   Diagnosis Date Noted  . Hyperemesis affecting pregnancy, antepartum 05/28/2018    PLAN: Hyperemesis gravidarum -continue D5%-LR for hydration -advance diet as tolerated, currently tolerating clears with minor nausea, asking for solids such as bread -continue tapering course of steroids as ordered by Dr. Leafy Ro -anti-emetics: Zofran ODT 36m q6h PRN, Phenergan suppository 236mq4h PRN, and Reglan 1028m6h PRN -recheck electrolytes in the morning   MarLisette GrinderNM 05/29/2018 4:43 PM  MarLisette Grinderrtified Nurse Midwife [facility]

## 2018-05-29 NOTE — Plan of Care (Signed)
Vs stable; up ad lib BUT if she feels dizzy to call for assistance; NPO due to nausea AND has U/S (right upper quadrant) scheduled for 05-29-18

## 2018-05-30 LAB — COMPREHENSIVE METABOLIC PANEL
ALT: 80 U/L — AB (ref 0–44)
AST: 42 U/L — AB (ref 15–41)
Albumin: 3.3 g/dL — ABNORMAL LOW (ref 3.5–5.0)
Alkaline Phosphatase: 43 U/L (ref 38–126)
Anion gap: 7 (ref 5–15)
BILIRUBIN TOTAL: 0.5 mg/dL (ref 0.3–1.2)
CHLORIDE: 109 mmol/L (ref 98–111)
CO2: 23 mmol/L (ref 22–32)
CREATININE: 0.54 mg/dL (ref 0.44–1.00)
Calcium: 8.8 mg/dL — ABNORMAL LOW (ref 8.9–10.3)
Glucose, Bld: 163 mg/dL — ABNORMAL HIGH (ref 70–99)
POTASSIUM: 3.5 mmol/L (ref 3.5–5.1)
Sodium: 139 mmol/L (ref 135–145)
Total Protein: 6.4 g/dL — ABNORMAL LOW (ref 6.5–8.1)

## 2018-05-30 LAB — GLUCOSE, CAPILLARY: Glucose-Capillary: 124 mg/dL — ABNORMAL HIGH (ref 70–99)

## 2018-05-30 MED ORDER — PROMETHAZINE HCL 25 MG RE SUPP: 25.0000 mg | Freq: Four times a day (QID) | RECTAL | 1 refills | Status: DC | PRN

## 2018-05-30 MED ORDER — ONDANSETRON 4 MG PO TBDP: 4.0000 mg | ORAL_TABLET | Freq: Four times a day (QID) | ORAL | 2 refills | Status: DC | PRN

## 2018-05-30 MED ORDER — PREDNISONE 10 MG (21) PO TBPK: ORAL_TABLET | ORAL | 0 refills | Status: DC

## 2018-05-30 NOTE — Discharge Instructions (Signed)
Steroid taper (prednisone): -40 mg oral prednisone per day for one day -20 mg per day for three days -10 mg per day for three days -5 mg per day for seven days  -14 days total

## 2018-05-30 NOTE — Progress Notes (Signed)
DELAYED ENTRY  ANTEPARTUM PROGRESS NOTE  Danielle Mcdaniel is a 27 y.o. G2P0 at 7w1dwho is admitted for hyperemesis gravidarum.   Estimated Date of Delivery: 12/31/18  Length of Stay:  2 Days. Admitted 05/28/2018  Subjective: Danielle Mcdaniel reports no vomiting since admission. She had eaten regular food this morning and is feeling pretty good.    She reports:  -no leakage of fluid -no vaginal bleeding -no cramping  Vitals:  BP 109/71 (BP Location: Left Arm)   Pulse 88   Temp 97.9 F (36.6 C) (Oral)   Resp 20   Ht '5\' 7"'$  (1.702 m)   Wt 108.9 kg (240 lb)   LMP 07/25/2017   SpO2 100%   BMI 37.59 kg/m  Physical Examination: General:   alert, cooperative, appears stated age and no distress  Skin:  normal and no rash or abnormalities  Neurologic:    Alert & oriented x 3  Lungs:   clear to auscultation bilaterally  Heart:   regular rate and rhythm, S1, S2 normal, no murmur, click, rub or gallop  Abdomen:  soft, non-tender; bowel sounds normal; no masses,  no organomegaly  Pelvis:  Exam deferred.  Extremities: : non-tender, symmetric, no edema bilaterally.      Results for orders placed or performed during the hospital encounter of 05/28/18 (from the past 48 hour(s))  Lipase, blood     Status: None   Collection Time: 05/28/18  6:47 PM  Result Value Ref Range   Lipase 51 11 - 51 U/L    Comment: [facility]formed at [facility] [address], [place] [id number] Comprehensive metabolic panel     Status: Abnormal   Collection Time: 05/28/18  6:47 PM  Result Value Ref Range   Sodium 136 135 - 145 mmol/L   Potassium 3.9 3.5 - 5.1 mmol/L   Chloride 107 98 - 111 mmol/L   CO2 15 (L) 22 - 32 mmol/L   Glucose, Bld 89 70 - 99 mg/dL   BUN 10 6 - 20 mg/dL   Creatinine, Ser 0.59 0.44 - 1.00 mg/dL   Calcium 10.0 8.9 - 10.3 mg/dL   Total Protein 8.1 6.5 - 8.1 g/dL   Albumin 4.2 3.5 - 5.0 g/dL   AST 25 15 - 41 U/L   ALT 55 (H) 0 - 44 U/L   Alkaline Phosphatase 50 38 - 126 U/L   Total Bilirubin 1.1 0.3 - 1.2 mg/dL   GFR calc non Af Amer >60 >60 mL/min   GFR calc Af Amer >60 >60 mL/min    Comment: (NOTE) The eGFR has been calculated using the CKD EPI equation. This calculation has not been validated in all clinical situations. eGFR's persistently <60 mL/min signify possible Chronic Kidney Disease.    Anion gap 14 5 - 15    Comment: [facility]formed at [facility] [address], [place] [address] CBC     Status: None   Collection Time: 05/28/18  6:47 PM  Result Value Ref Range   WBC 9.7 3.6 - 11.0 K/uL   RBC 5.04 3.80 - 5.20 MIL/uL   Hemoglobin 14.3 12.0 - 16.0 g/dL   HCT 42.0 35.0 - 47.0 %   MCV 83.3 80.0 - 100.0 fL   MCH 28.4 26.0 - 34.0 pg   MCHC 34.1 32.0 - 36.0 g/dL   RDW 14.4 11.5 - 14.5 %   Platelets 324 150 - 440 K/uL    Comment: [facility] at [facility]  [place], [place] [address]  hCG, quantitative, pregnancy     Status: Abnormal   Collection Time: 05/28/18  7:50 PM  Result Value Ref Range   hCG, Beta Chain, Quant, S 259,704 (H) <5 mIU/mL    Comment:          GEST. AGE      CONC.  (mIU/mL)   <=1 WEEK        5 - 50     2 WEEKS       50 - 500     3 WEEKS       100 - 10,000     4 WEEKS     1,000 - 30,000     5 WEEKS     3,500 - 115,000   6-8 WEEKS     12,000 - 270,000    12 WEEKS     15,000 - 220,000        FEMALE AND NON-PREGNANT FEMALE:     LESS THAN 5 mIU/mL Performed at [facility], [address] [place], [place] [address]   Urinalysis, Complete w Microscopic     Status: Abnormal   Collection Time: 05/28/18 11:08 PM  Result Value Ref Range   Color, Urine YELLOW (A) YELLOW   APPearance CLEAR (A) CLEAR   Specific Gravity, Urine 1.031 (H) 1.005 - 1.030   pH 5.0 5.0 - 8.0   Glucose, UA NEGATIVE NEGATIVE mg/dL   Hgb urine dipstick SMALL (A) NEGATIVE   Bilirubin Urine NEGATIVE NEGATIVE   Ketones, ur 80 (A) NEGATIVE mg/dL   Protein, ur 100 (A) NEGATIVE mg/dL   Nitrite NEGATIVE  NEGATIVE   Leukocytes, UA NEGATIVE NEGATIVE   RBC / HPF 0-5 0 - 5 RBC/hpf   WBC, UA 0-5 0 - 5 WBC/hpf   Bacteria, UA NONE SEEN NONE SEEN   Squamous Epithelial / LPF 0-5 0 - 5   Mucus PRESENT     Comment: Performed at [facility], [address] [place], [place] [address]  Type and screen Reader     Status: None   Collection Time: 05/29/18  5:23 AM  Result Value Ref Range   ABO/RH(D) B POS    Antibody Screen NEG    Sample Expiration      06/01/2018 Performed at [facility], [address] [place], [place] [address]   Glucose, fasting - daily while on taper     Status: Abnormal   Collection Time: 05/29/18  5:23 AM  Result Value Ref Range   Glucose, Bld 183 (H) 70 - 99 mg/dL    Comment: Performed at [facility], [address] [place], [place] [address]  Comprehensive metabolic panel     Status: Abnormal   Collection Time: 05/30/18  5:48 AM  Result Value Ref Range   Sodium 139 135 - 145 mmol/L   Potassium 3.5 3.5 - 5.1 mmol/L   Chloride 109 98 - 111 mmol/L   CO2 23 22 - 32 mmol/L   Glucose, Bld 163 (H) 70 - 99 mg/dL   BUN <5 (L) 6 - 20 mg/dL   Creatinine, Ser 0.54 0.44 - 1.00 mg/dL   Calcium 8.8 (L) 8.9 - 10.3 mg/dL   Total Protein 6.4 (L) 6.5 - 8.1 g/dL   Albumin 3.3 (L) 3.5 - 5.0 g/dL   AST 42 (H) 15 - 41 U/L   ALT 80 (H) 0 - 44 U/L   Alkaline Phosphatase 43 38 -  126 U/L   Total Bilirubin 0.5 0.3 - 1.2 mg/dL   GFR calc non Af Amer >60 >60 mL/min   GFR calc Af Amer >60 >60 mL/min    Comment: (NOTE) The eGFR has been calculated using the CKD EPI equation. This calculation has not been validated in all clinical situations. eGFR's persistently <60 mL/min signify possible Chronic Kidney Disease.    Anion gap 7 5 - 15    Comment: Performed at [facility], [address] [place],  [address]    US Abdomen Limited Ruq  Result Date: 05/29/2018 CLINICAL DATA:  Right upper quadrant pain EXAM:  ULTRASOUND ABDOMEN LIMITED RIGHT UPPER QUADRANT COMPARISON:  None. FINDINGS: Gallbladder: Gallbladder is well distended with echogenic material within consistent with tumefactive sludge. No definitive calcified stones are seen. No wall thickening is noted. Common bile duct: Diameter: 4.4 mm. Liver: No focal lesion identified. Within normal limits in parenchymal echogenicity. Portal vein is patent on color Doppler imaging with normal direction of blood flow towards the liver. IMPRESSION: Gallbladder sludge.  No other focal abnormality is noted. Electronically Signed   By: Inez Catalina M.D.   On: 05/29/2018 08:13    Current scheduled medications . docusate sodium  100 mg Oral Daily  . feeding supplement  1 Container Oral TID BM  . methylPREDNISolone  16 mg Oral Q breakfast   Followed by  . [START ON 06/02/2018] methylPREDNISolone  8 mg Oral Q breakfast   Followed by  . [START ON 06/09/2018] methylPREDNISolone  4 mg Oral Q breakfast  . methylPREDNISolone  16 mg Oral Q1400   Followed by  . [START ON 05/31/2018] methylPREDNISolone  8 mg Oral Q1400   Followed by  . [START ON 06/03/2018] methylPREDNISolone  4 mg Oral Q1400  . methylPREDNISolone  16 mg Oral QHS   Followed by  . [START ON 06/01/2018] methylPREDNISolone  8 mg Oral QHS   Followed by  . [START ON 06/04/2018] methylPREDNISolone  4 mg Oral QHS    I have reviewed the patient's current medications.  ASSESSMENT: Patient Active Problem List   Diagnosis Date Noted  . Hyperemesis affecting pregnancy, antepartum 05/28/2018    PLAN: Hyperemesis gravidarum -Elevated fasting blood sugar, likely due to steroids and administration of D5% IVF. Plan to recheck after d/c of D5% IVF. -Currently tolerating regular diet and fluids, continue this along with supplemental nutrition drinks, and d/c IVF.  -Continue tapering course of steroids as ordered by Dr. Leafy Ro. -Anti-emetics: Zofran ODT '4mg'$  q6h PRN, Phenergan suppository '25mg'$  q4h PRN, and Reglan '10mg'$   q6h PRN. -Electrolytes WNL. -Plan for discharge home later today.    Lisette Grinder, [place] 05/30/2018 10:29 AM  Lisette Grinder Certified Nurse Midwife [facility]

## 2018-05-30 NOTE — Progress Notes (Signed)
Discharge instructions complete and prescriptions sent to pharmacy by provider. Patient verbalizes understanding of teaching. Patient discharged home via wheelchair at 1500.

## 2018-05-30 NOTE — Discharge Summary (Signed)
Patient ID: Danielle Mcdaniel MRN: [id number] DOB/AGE: 03/06/1991 27 y.o.  Admit date: 05/28/2018 Discharge date: 05/30/2018  Admission Diagnoses: -Hyperemesis affecting pregnancy  Discharge Diagnoses:  -Hyperemesis affecting pregnancy  Prenatal Procedures: none  Consults: none  Significant Diagnostic Studies:  Results for orders placed or performed during the hospital encounter of 05/28/18 (from the past 168 hour(s))  Lipase, blood   Collection Time: 05/28/18  6:47 PM  Result Value Ref Range   Lipase 51 11 - 51 U/L  Comprehensive metabolic panel   Collection Time: 05/28/18  6:47 PM  Result Value Ref Range   Sodium 136 135 - 145 mmol/L   Potassium 3.9 3.5 - 5.1 mmol/L   Chloride 107 98 - 111 mmol/L   CO2 15 (L) 22 - 32 mmol/L   Glucose, Bld 89 70 - 99 mg/dL   BUN 10 6 - 20 mg/dL   Creatinine, Ser 4.09 0.44 - 1.00 mg/dL   Calcium 81.1 8.9 - 91.4 mg/dL   Total Protein 8.1 6.5 - 8.1 g/dL   Albumin 4.2 3.5 - 5.0 g/dL   AST 25 15 - 41 U/L   ALT 55 (H) 0 - 44 U/L   Alkaline Phosphatase 50 38 - 126 U/L   Total Bilirubin 1.1 0.3 - 1.2 mg/dL   GFR calc non Af Amer >60 >60 mL/min   GFR calc Af Amer >60 >60 mL/min   Anion gap 14 5 - 15  CBC   Collection Time: 05/28/18  6:47 PM  Result Value Ref Range   WBC 9.7 3.6 - 11.0 K/uL   RBC 5.04 3.80 - 5.20 MIL/uL   Hemoglobin 14.3 12.0 - 16.0 g/dL   HCT 78.2 95.6 - 21.3 %   MCV 83.3 80.0 - 100.0 fL   MCH 28.4 26.0 - 34.0 pg   MCHC 34.1 32.0 - 36.0 g/dL   RDW 08.6 57.8 - 46.9 %   Platelets 324 150 - 440 K/uL  hCG, quantitative, pregnancy   Collection Time: 05/28/18  7:50 PM  Result Value Ref Range   hCG, Beta Chain, Quant, S 259,704 (H) <5 mIU/mL  Urinalysis, Complete w Microscopic   Collection Time: 05/28/18 11:08 PM  Result Value Ref Range   Color, Urine YELLOW (A) YELLOW   APPearance CLEAR (A) CLEAR   Specific Gravity, Urine 1.031 (H) 1.005 - 1.030   pH 5.0 5.0 - 8.0   Glucose, UA NEGATIVE NEGATIVE mg/dL   Hgb urine  dipstick SMALL (A) NEGATIVE   Bilirubin Urine NEGATIVE NEGATIVE   Ketones, ur 80 (A) NEGATIVE mg/dL   Protein, ur 629 (A) NEGATIVE mg/dL   Nitrite NEGATIVE NEGATIVE   Leukocytes, UA NEGATIVE NEGATIVE   RBC / HPF 0-5 0 - 5 RBC/hpf   WBC, UA 0-5 0 - 5 WBC/hpf   Bacteria, UA NONE SEEN NONE SEEN   Squamous Epithelial / LPF 0-5 0 - 5   Mucus PRESENT   Glucose, fasting - daily while on taper   Collection Time: 05/29/18  5:23 AM  Result Value Ref Range   Glucose, Bld 183 (H) 70 - 99 mg/dL  Type and screen [facility]   Collection Time: 05/29/18  5:23 AM  Result Value Ref Range   ABO/RH(D) B POS    Antibody Screen NEG    Sample Expiration      06/01/2018 Performed at [facility], [address] [place], [place] [address]   Comprehensive metabolic panel   Collection Time: 05/30/18  5:48 AM  Result Value Ref Range   Sodium 139 135 - 145 mmol/L   Potassium 3.5 3.5 - 5.1 mmol/L   Chloride 109 98 - 111 mmol/L   CO2 23 22 - 32 mmol/L   Glucose, Bld 163 (H) 70 - 99 mg/dL   BUN <5 (L) 6 - 20 mg/dL   Creatinine, Ser 4.69 0.44 - 1.00 mg/dL   Calcium 8.8 (L) 8.9 - 10.3 mg/dL   Total Protein 6.4 (L) 6.5 - 8.1 g/dL   Albumin 3.3 (L) 3.5 - 5.0 g/dL   AST 42 (H) 15 - 41 U/L   ALT 80 (H) 0 - 44 U/L   Alkaline Phosphatase 43 38 - 126 U/L   Total Bilirubin 0.5 0.3 - 1.2 mg/dL   GFR calc non Af Amer >60 >60 mL/min   GFR calc Af Amer >60 >60 mL/min   Anion gap 7 5 - 15  Glucose, capillary   Collection Time: 05/30/18 12:38 PM  Result Value Ref Range   Glucose-Capillary 124 (H) 70 - 99 mg/dL   Comment 1 Notify RN     Treatments: IV hydration, IV vitamin supplementation, anti-emetics, steroids  Hospital Course:  This is a 27 y.o. G3P1101 with IUP at [redacted]w[redacted]d admitted for hyperemesis gravidarum. She has been seen multiple times in the ED or urgent care in the past month for the same. On day of admission, she was throwing up anything that she had to drink as well as  PO Zofran. She was treated with IVF bolus and continuous IVF, IV MVI and thiamine, anti-emetics, steroids, and bowel rest. Diet was advanced as tolerated once her nausea improved and she had the desire to eat, and today she is tolerating a regular diet and fluids by mouth. She is feeling much improved and feels ready to go home. She was deemed stable for discharge to home with outpatient follow up.  Discharge Physical Exam:  BP 108/70 (BP Location: Left Arm)   Pulse 87   Temp 98.7 F (37.1 C) (Oral)   Resp 18   Ht 5\' 7"  (1.702 m)   Wt 108.9 kg (240 lb)   LMP 07/25/2017   SpO2 100%   BMI 37.59 kg/m   Physical Examination: General:   alert, cooperative, appears stated age and no distress   Skin:  normal and no rash or abnormalities   Neurologic:    Alert & oriented x 3   Lungs:   clear to auscultation bilaterally, normal effort   Heart:   regular rate and rhythm, S1, S2 normal, no murmur, click, rub or gallop   Abdomen:  soft, non-tender; bowel sounds normal; no masses,  no organomegaly   Pelvis:  Exam deferred.   Extremities: : non-tender, symmetric, no edema bilaterally, no evidence of DVT on exam    Discharge Condition: Stable  Disposition: Discharge disposition: 01-Home or Self Care       Allergies as of 05/30/2018      Reactions   Penicillins Rash   Has patient had a PCN reaction causing immediate rash, facial/tongue/throat swelling, SOB or lightheadedness with hypotension: Yes Has patient had a PCN reaction causing severe rash involving mucus membranes or skin necrosis: No Has patient had a PCN reaction that required hospitalization: No Has patient had a PCN reaction occurring within the last 10 years: No If all of the above answers are "NO", then may proceed with Cephalosporin use.      Medication List    TAKE these medications   Doxylamine-Pyridoxine 10-10  MG Tbec Commonly known as:  DICLEGIS Two tablets at bedtime on day 1 and 2; if symptoms persist, take 1  tablet in morning and 2 tablets at bedtime on day 3; if symptoms persist, may increase to 1 tablet in morning, 1 tablet mid-afternoon, and 2 tablets at bedtime on day 4 for a maximum of 4 tablets per day. Use the minimum dose necessary to control your symptoms.   ondansetron 4 MG disintegrating tablet Commonly known as:  ZOFRAN-ODT Take 1 tablet (4 mg total) by mouth every 6 (six) hours as needed for nausea or vomiting. What changed:    when to take this  reasons to take this   predniSONE 10 MG (21) Tbpk tablet Commonly known as:  STERAPRED UNI-PAK 21 TAB Take 4 tablets (40mg ) on day 1, 2 tablets (20mg ) on days 2-4, 1 tablets (10mg ) on days 5-7, and 1/2 tablet (5mg ) on days 8-14   promethazine 25 MG suppository Commonly known as:  PHENERGAN Place 1 suppository (25 mg total) rectally every 6 (six) hours as needed for nausea or vomiting.      Follow-up Information    [facility].   Why:  Follow-up as scheduled Contact information: [address] [place] [id number] [phone]          Signed: Genia DelMargaret Cynthia Cogle 05/30/2018 1:13 PM ----- Genia DelMargaret Amarachukwu Lakatos, CNM Certified Nurse Midwife [facility]

## 2018-06-03 ENCOUNTER — Other Ambulatory Visit: Payer: Self-pay | Admitting: Obstetrics and Gynecology

## 2018-06-03 DIAGNOSIS — Z369 Encounter for antenatal screening, unspecified: Secondary | ICD-10-CM

## 2018-06-18 ENCOUNTER — Ambulatory Visit: Payer: BC Managed Care – PPO | Admitting: *Deleted

## 2018-06-25 ENCOUNTER — Ambulatory Visit: Payer: BC Managed Care – PPO

## 2018-07-08 DIAGNOSIS — Z7689 Persons encountering health services in other specified circumstances: Secondary | ICD-10-CM | POA: Insufficient documentation

## 2018-10-02 DIAGNOSIS — Z3A27 27 weeks gestation of pregnancy: Secondary | ICD-10-CM | POA: Insufficient documentation

## 2018-10-02 DIAGNOSIS — O9A213 Injury, poisoning and certain other consequences of external causes complicating pregnancy, third trimester: Secondary | ICD-10-CM | POA: Insufficient documentation

## 2018-11-29 IMAGING — US US PELVIS COMPLETE TRANSABD/TRANSVAG
1 series · 14 of 25 positions shown · non-contrast
Comparison: None

CLINICAL DATA: Pelvic cramping.

EXAM:
TRANSABDOMINAL AND TRANSVAGINAL ULTRASOUND OF PELVIS
TECHNIQUE: Both transabdominal and transvaginal ultrasound examinations of the
pelvis were performed. Transabdominal technique was performed for
global imaging of the pelvis including uterus, ovaries, adnexal
regions, and pelvic cul-de-sac. It was necessary to proceed with
endovaginal exam following the transabdominal exam to visualize the
endometrium and ovaries.

[Series 1: us pelvis complete transabd/transvag · 0.20mm/px · 14 of 68 slices shown]
[im 1/68]
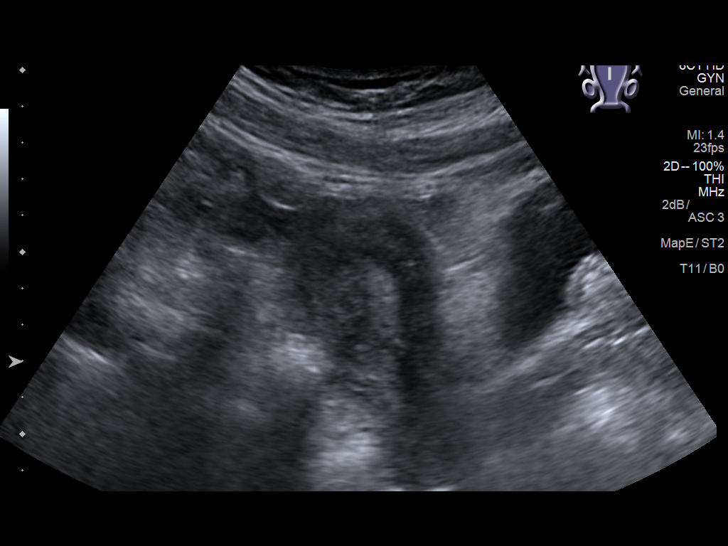
[im 6/68]
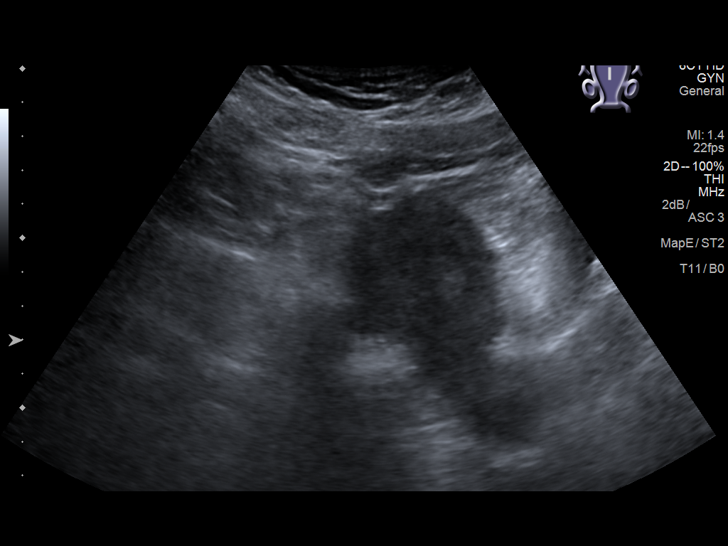
[im 12/68]
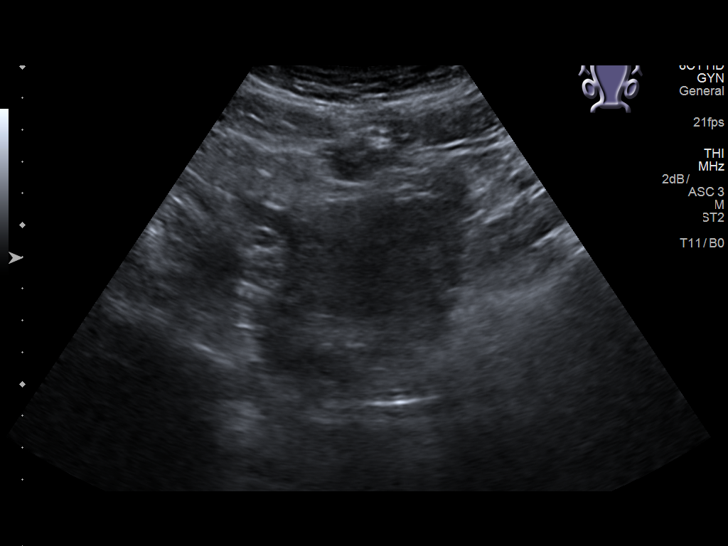
[im 17/68]
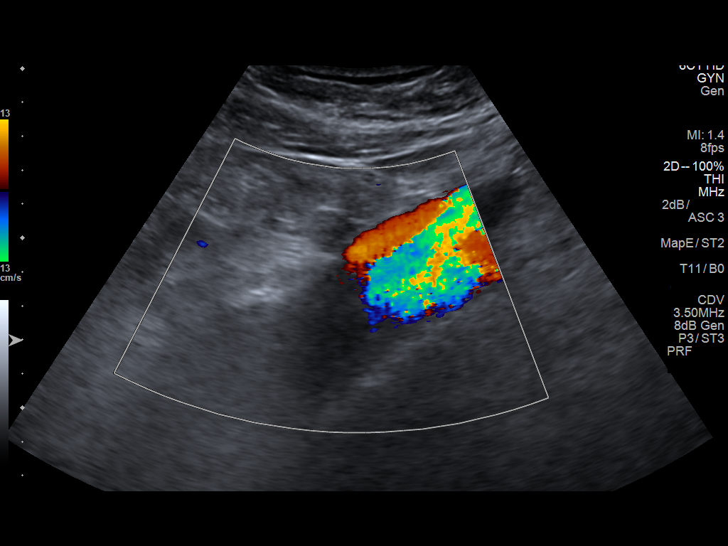
[im 23/68]
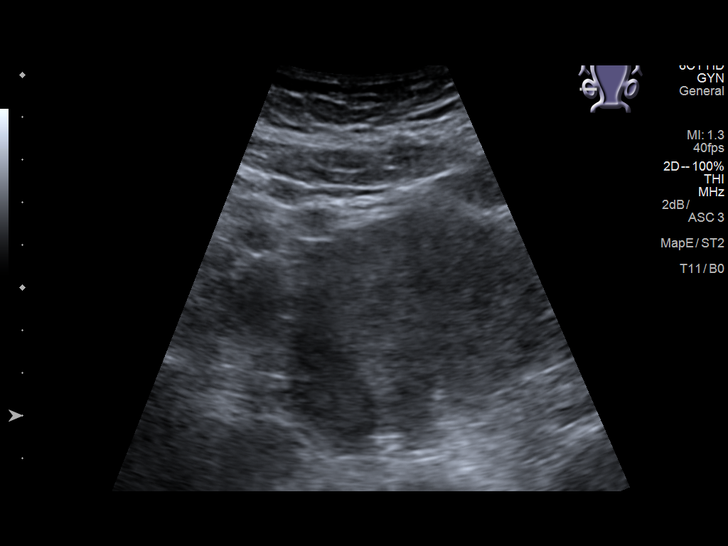
[im 26/68]
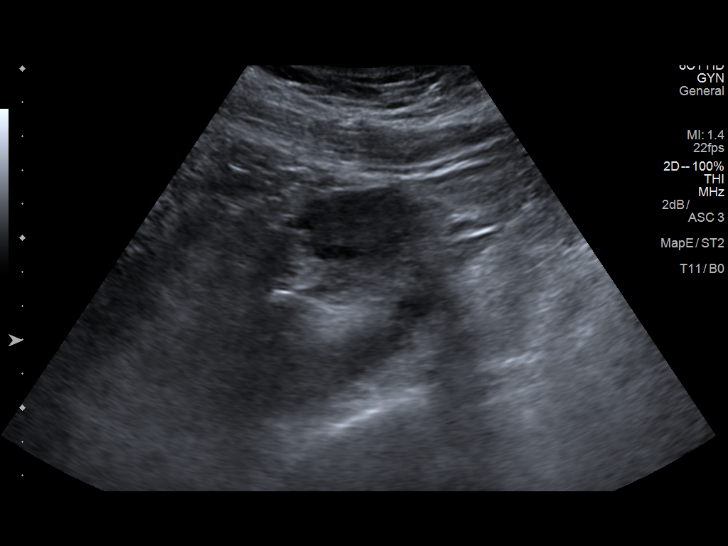
[im 31/68]
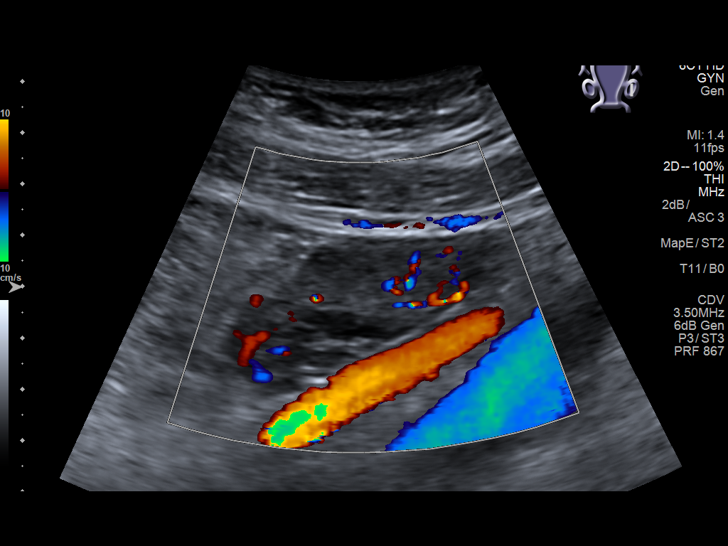
[im 37/68]
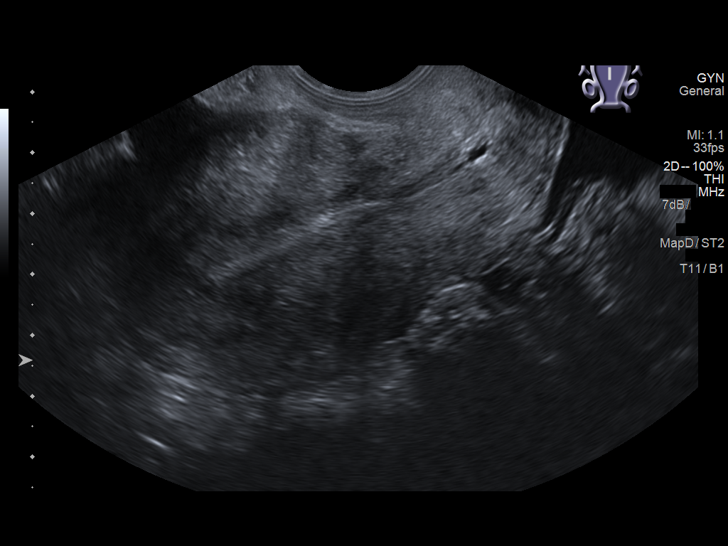
[im 42/68]
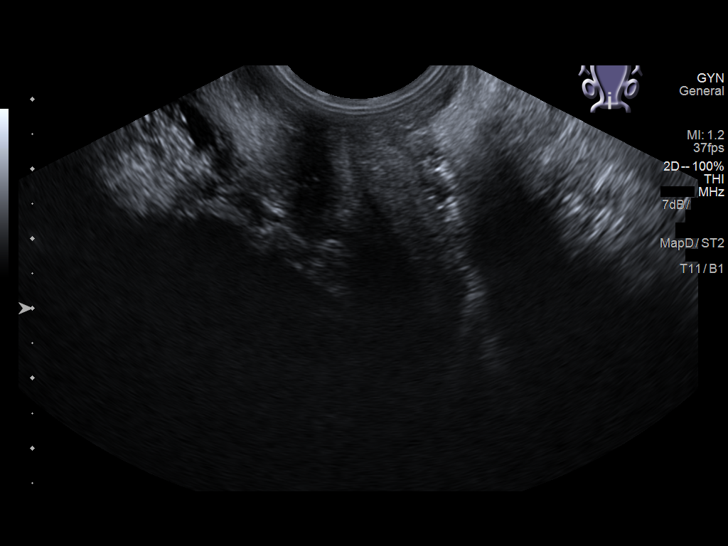
[im 45/68]
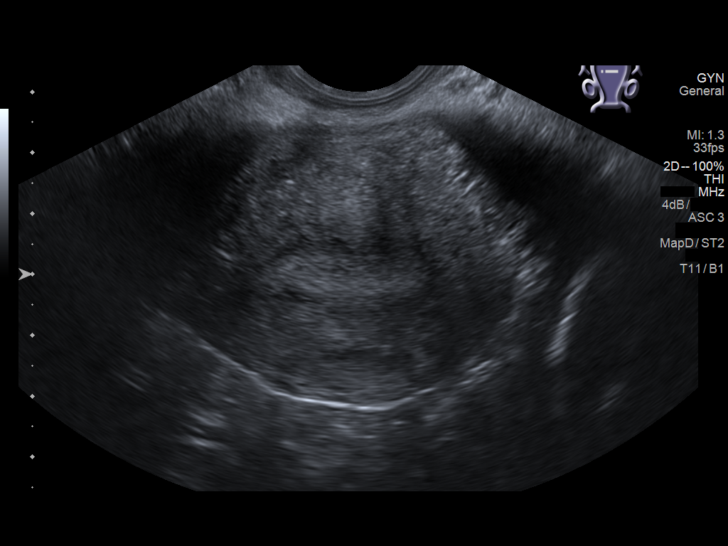
[im 51/68]
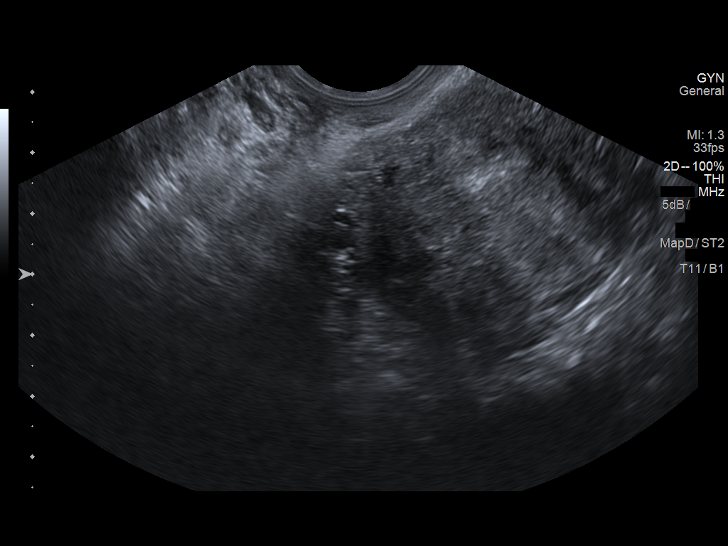
[im 56/68]
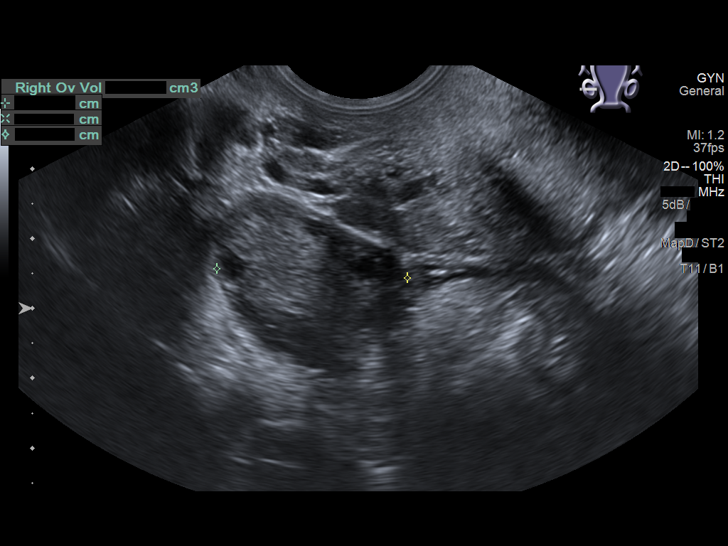
[im 62/68]
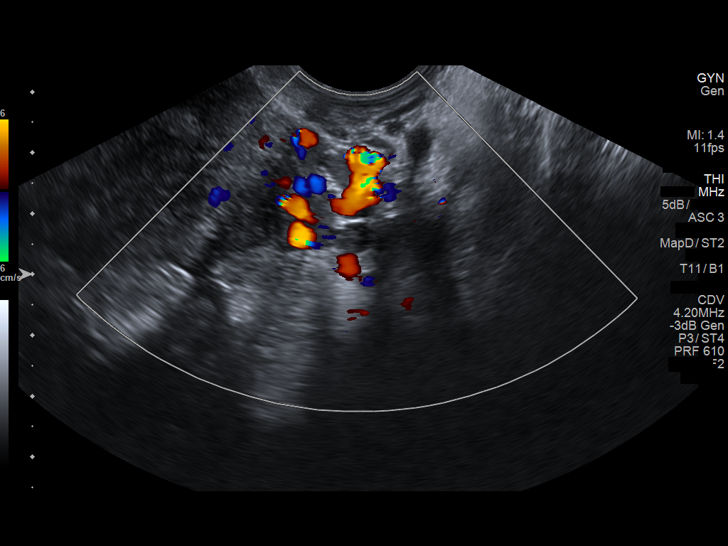
[im 68/68]
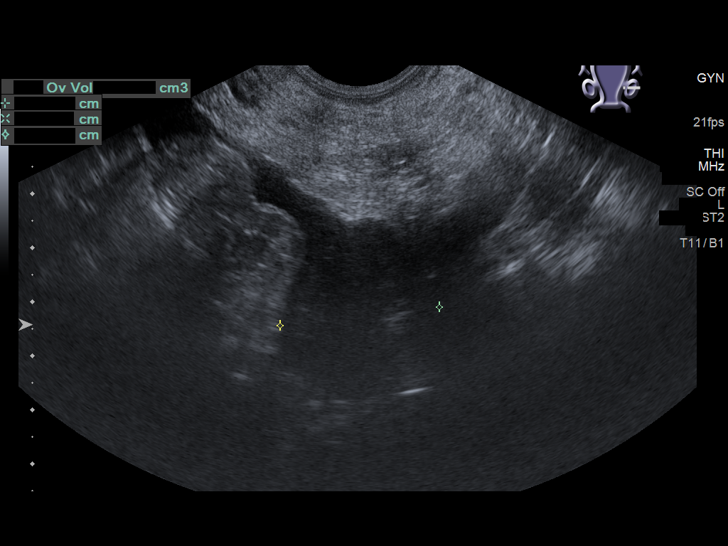

[14 of 25 positions shown; findings below may reference images not displayed]

FINDINGS: Uterus

Measurements: 8.6 x 4.4 x 5.3 cm. No fibroids or other mass
visualized.

Endometrium

Thickness: 8.5 mm.  No focal abnormality visualized.

Right ovary

Measurements: 3.8 x 2.5 x 2.7 cm. Normal appearance/no adnexal mass.

Left ovary

Measurements: 3.9 x 3.1 x 3 cm. Normal appearance/no adnexal mass.

Other findings

Small amount of pelvic free fluid likely physiologic.
IMPRESSION: 1. Normal pelvic ultrasound.

## 2018-12-16 DIAGNOSIS — E559 Vitamin D deficiency, unspecified: Secondary | ICD-10-CM | POA: Insufficient documentation
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
## 2019-08-30 DIAGNOSIS — E1165 Type 2 diabetes mellitus with hyperglycemia: Secondary | ICD-10-CM | POA: Insufficient documentation

## 2022-02-14 DIAGNOSIS — O99013 Anemia complicating pregnancy, third trimester: Secondary | ICD-10-CM | POA: Insufficient documentation

## 2022-03-14 DIAGNOSIS — O99810 Abnormal glucose complicating pregnancy: Secondary | ICD-10-CM | POA: Insufficient documentation

## 2022-05-08 DIAGNOSIS — M79605 Pain in left leg: Secondary | ICD-10-CM | POA: Insufficient documentation

## 2022-05-15 DIAGNOSIS — R102 Pelvic and perineal pain unspecified side: Secondary | ICD-10-CM | POA: Insufficient documentation

## 2022-08-16 DIAGNOSIS — G4486 Cervicogenic headache: Secondary | ICD-10-CM | POA: Insufficient documentation

## 2022-08-20 DIAGNOSIS — N132 Hydronephrosis with renal and ureteral calculous obstruction: Secondary | ICD-10-CM | POA: Insufficient documentation

## 2022-08-20 DIAGNOSIS — R599 Enlarged lymph nodes, unspecified: Secondary | ICD-10-CM | POA: Insufficient documentation

## 2022-08-25 DIAGNOSIS — N912 Amenorrhea, unspecified: Secondary | ICD-10-CM | POA: Insufficient documentation

## 2022-10-27 DIAGNOSIS — G5622 Lesion of ulnar nerve, left upper limb: Secondary | ICD-10-CM | POA: Insufficient documentation

## 2022-10-27 DIAGNOSIS — G5600 Carpal tunnel syndrome, unspecified upper limb: Secondary | ICD-10-CM | POA: Insufficient documentation

## 2022-10-27 DIAGNOSIS — S60221A Contusion of right hand, initial encounter: Secondary | ICD-10-CM | POA: Insufficient documentation

## 2022-12-10 DIAGNOSIS — K760 Fatty (change of) liver, not elsewhere classified: Secondary | ICD-10-CM | POA: Insufficient documentation

## 2022-12-10 DIAGNOSIS — R10A1 Flank pain, right side: Secondary | ICD-10-CM | POA: Insufficient documentation

## 2023-01-03 DIAGNOSIS — F419 Anxiety disorder, unspecified: Secondary | ICD-10-CM | POA: Insufficient documentation

## 2023-01-10 DIAGNOSIS — R3129 Other microscopic hematuria: Secondary | ICD-10-CM | POA: Insufficient documentation

## 2024-06-03 DIAGNOSIS — S93402A Sprain of unspecified ligament of left ankle, initial encounter: Secondary | ICD-10-CM | POA: Insufficient documentation

## 2024-06-03 DIAGNOSIS — M659 Unspecified synovitis and tenosynovitis, unspecified site: Secondary | ICD-10-CM | POA: Insufficient documentation

## 2024-06-24 ENCOUNTER — Emergency Department
Admission: EM | Admit: 2024-06-24 | Discharge: 2024-06-24 | Disposition: A | Discharge status: Home / Self Care | Attending: Emergency Medicine | Admitting: Emergency Medicine

## 2024-06-24 ENCOUNTER — Encounter: Payer: Self-pay | Admitting: Emergency Medicine

## 2024-06-24 ENCOUNTER — Emergency Department

## 2024-06-24 ENCOUNTER — Other Ambulatory Visit: Payer: Self-pay

## 2024-06-24 DIAGNOSIS — W1842XA Slipping, tripping and stumbling without falling due to stepping into hole or opening, initial encounter: Secondary | ICD-10-CM | POA: Diagnosis not present

## 2024-06-24 DIAGNOSIS — E119 Type 2 diabetes mellitus without complications: Secondary | ICD-10-CM | POA: Insufficient documentation

## 2024-06-24 DIAGNOSIS — S99912A Unspecified injury of left ankle, initial encounter: Secondary | ICD-10-CM | POA: Diagnosis present

## 2024-06-24 DIAGNOSIS — R259 Unspecified abnormal involuntary movements: Secondary | ICD-10-CM | POA: Insufficient documentation

## 2024-06-24 DIAGNOSIS — S93402A Sprain of unspecified ligament of left ankle, initial encounter: Secondary | ICD-10-CM | POA: Diagnosis not present

## 2024-06-24 MED ORDER — OXYCODONE-ACETAMINOPHEN 5-325 MG PO TABS
1.0000 | ORAL_TABLET | Freq: Once | ORAL | Status: AC
  Administered 2024-06-24: 1 via ORAL
  Filled 2024-06-24: qty 1

## 2024-06-24 MED ORDER — OXYCODONE-ACETAMINOPHEN 5-325 MG PO TABS: 1.0000 | ORAL_TABLET | ORAL | 0 refills | Status: DC | PRN

## 2024-06-24 MED ORDER — CYCLOBENZAPRINE HCL 10 MG PO TABS
10.0000 mg | ORAL_TABLET | Freq: Once | ORAL | Status: AC
  Administered 2024-06-24: 10 mg via ORAL
  Filled 2024-06-24: qty 1

## 2024-06-24 MED ORDER — BACLOFEN 10 MG PO TABS: 10.0000 mg | ORAL_TABLET | Freq: Three times a day (TID) | ORAL | 0 refills | Status: AC

## 2024-06-24 NOTE — ED Notes (Signed)
 See triage note  Presents s/p fall  States she stepped in a hole   Twisted left ankle  Ankle is swollen and unable to bear wt  Good pulses

## 2024-06-24 NOTE — ED Triage Notes (Signed)
 Pt to ED via POV, Pt states that she was walking this morning and stepped in a hole, Pt rolled her left ankle. Pt has pain and swelling in her ankle, Pt has previous injury to same ankle.

## 2024-06-24 NOTE — ED Provider Notes (Signed)
 [facility] Provider Note    Event Date/Time   First MD Initiated Contact with Patient 06/24/24 1103     (approximate)   History   Ankle Pain   HPI  Danielle Mcdaniel is a 33 y.o. female history of diabetes, recent ankle sprain presents emergency department stating that she stepped in a hole this morning.  Rolled the left ankle again.  Has just healed from previous sprain.  Now has pain and swelling in her ankle and hurts into the top of the foot.  States now the foot feels numb.  No other injuries reported.      Physical Exam   Triage Vital Signs: ED Triage Vitals  Encounter Vitals Group     BP 06/24/24 1011 (P) 135/63     Girls Systolic BP Percentile --      Girls Diastolic BP Percentile --      Boys Systolic BP Percentile --      Boys Diastolic BP Percentile --      Pulse Rate 06/24/24 1011 (P) 93     Resp 06/24/24 1011 (!) (P) 21     Temp 06/24/24 1011 (P) 98.9 F (37.2 C)     Temp src --      SpO2 06/24/24 1011 (P) 100 %     Weight 06/24/24 1014 260 lb (117.9 kg)     Height 06/24/24 1014 5' 6 (1.676 m)     Head Circumference --      Peak Flow --      Pain Score 06/24/24 1014 10     Pain Loc --      Pain Education --      Exclude from Growth Chart --     Most recent vital signs: Vitals:   06/24/24 1011  BP: (P) 135/63  Pulse: (P) 93  Resp: (!) (P) 21  Temp: (P) 98.9 F (37.2 C)  SpO2: (P) 100%     General: Awake, no distress.   CV:  Good peripheral perfusion.  Resp:  Normal effort.  Abd:  No distention.   Other:  Left ankle swollen at lateral malleolus, Achilles appears to be intact, also tender along the fifth metatarsal and across the cuboid's.  Decreased range of motion secondary to pain.SABRA  Neurovascular intact   ED Results / Procedures / Treatments   Labs (all labs ordered are listed, but only abnormal results are displayed) Labs Reviewed - No data to display   EKG     RADIOLOGY X-ray of the left ankle,  x-ray left foot    PROCEDURES:   Procedures  Critical Care:  no Chief Complaint  Patient presents with   Ankle Pain      MEDICATIONS ORDERED IN ED: Medications  oxyCODONE -acetaminophen  (PERCOCET/ROXICET) 5-325 MG per tablet 1 tablet (1 tablet Oral Given 06/24/24 1131)  cyclobenzaprine  (FLEXERIL ) tablet 10 mg (10 mg Oral Given 06/24/24 1204)     IMPRESSION / MDM / ASSESSMENT AND PLAN / ED COURSE  I reviewed the triage vital signs and the nursing notes.                              Differential diagnosis includes, but is not limited to, fracture, sprain, ligament tear, Achilles injury  Patient's presentation is most consistent with acute illness / injury with system symptoms.    Medications given: Percocet 1 p.o.  X-ray left ankle, x-ray left foot  X-ray left ankle  and left foot are independently reviewed interpreted by me as being negative for any acute abnormality.  The patient continues to have large spasms noted up and down her leg.  Will do a muscle relaxer to see if this helps.  She is placed in a posterior OCL due to this being the second severe ankle injury with severe pain.  At this time I feel she needs to be on crutches and she should not bear weight until evaluated by podiatry.  She can either follow-up podiatry at [facility], [facility] or [facility].  She is to check to see which one her insurance coverage.  She is in agreement with this treatment plan.  Did offer a work note.  She defers at this time.  Was discharged stable condition.  Prescription for Percocet and baclofen  given      FINAL CLINICAL IMPRESSION(S) / ED DIAGNOSES   Final diagnoses:  Sprain of left ankle, unspecified ligament, initial encounter     Rx / DC Orders   ED Discharge Orders          Ordered    oxyCODONE -acetaminophen  (PERCOCET) 5-325 MG tablet  Every 4 hours PRN        06/24/24 1201    baclofen  (LIORESAL ) 10 MG tablet  3 times daily        06/24/24 1201              Note:  This document was prepared using Dragon voice recognition software and may include unintentional dictation errors.    Gasper Devere ORN, PA-C 06/24/24 1220    Ernest Ronal BRAVO, MD 06/25/24 [phone]

## 2024-06-24 NOTE — ED Triage Notes (Addendum)
 Pt presents to the ED via [facility] with complaints of muscle spasms in the L ankle. Pt states she was seen here earlier for an ankle injury but the spasms were present prior to having a splint applied. Rates the pain 8/10. Pt received a muscle relaxer and pain meds prior to D/C but she is unsure if it was effective. A&Ox4 at this time. Denies CP or SOB.

## 2024-06-24 NOTE — Discharge Instructions (Addendum)
 Follow-up with either your foot specialist or one of the foot specialist listed above.  I would recommend you elevate and ice the leg.  Take medications as prescribed.  Return emergency department if worsening.

## 2024-06-25 ENCOUNTER — Emergency Department
Admission: EM | Admit: 2024-06-25 | Discharge: 2024-06-25 | Disposition: A | Discharge status: Home / Self Care | Source: Home / Self Care | Attending: Emergency Medicine | Admitting: Emergency Medicine

## 2024-06-25 DIAGNOSIS — R259 Unspecified abnormal involuntary movements: Secondary | ICD-10-CM

## 2024-06-25 LAB — COMPREHENSIVE METABOLIC PANEL WITH GFR
ALT: 16 U/L (ref 0–44)
AST: 27 U/L (ref 15–41)
Albumin: 4 g/dL (ref 3.5–5.0)
Alkaline Phosphatase: 45 U/L (ref 38–126)
Anion gap: 8 (ref 5–15)
BUN: 19 mg/dL (ref 6–20)
CO2: 25 mmol/L (ref 22–32)
Calcium: 9.1 mg/dL (ref 8.9–10.3)
Chloride: 105 mmol/L (ref 98–111)
Creatinine, Ser: 0.92 mg/dL (ref 0.44–1.00)
GFR, Estimated: >60 mL/min (ref >60)
Glucose, Bld: 100 mg/dL — ABNORMAL HIGH (ref 70–99)
Potassium: 4.5 mmol/L (ref 3.5–5.1)
Sodium: 138 mmol/L (ref 135–145)
Total Bilirubin: 0.4 mg/dL (ref 0.0–1.2)
Total Protein: 7.3 g/dL (ref 6.5–8.1)

## 2024-06-25 LAB — CBC WITH DIFFERENTIAL/PLATELET
Abs Immature Granulocytes: 0.01 K/uL (ref 0.00–0.07)
Basophils Absolute: 0 K/uL (ref 0.0–0.1)
Basophils Relative: 0 %
Eosinophils Absolute: 0.1 K/uL (ref 0.0–0.5)
Eosinophils Relative: 1 %
HCT: 36.2 % (ref 36.0–46.0)
Hemoglobin: 12 g/dL (ref 12.0–15.0)
Immature Granulocytes: 0 %
Lymphocytes Relative: 31 %
Lymphs Abs: 2.1 K/uL (ref 0.7–4.0)
MCH: 27.2 pg (ref 26.0–34.0)
MCHC: 33.1 g/dL (ref 30.0–36.0)
MCV: 82.1 fL (ref 80.0–100.0)
Monocytes Absolute: 0.5 K/uL (ref 0.1–1.0)
Monocytes Relative: 7 %
Neutro Abs: 4.2 K/uL (ref 1.7–7.7)
Neutrophils Relative %: 61 %
Platelets: 323 K/uL (ref 150–400)
RBC: 4.41 MIL/uL (ref 3.87–5.11)
RDW: 13.6 % (ref 11.5–15.5)
WBC: 6.9 K/uL (ref 4.0–10.5)
nRBC: 0 % (ref 0.0–0.2)

## 2024-06-25 LAB — HCG, QUANTITATIVE, PREGNANCY: hCG, Beta Chain, Quant, S: 1 m[IU]/mL (ref <5)

## 2024-06-25 LAB — CK: Total CK: 313 U/L — ABNORMAL HIGH (ref 38–234)

## 2024-06-25 LAB — MAGNESIUM: Magnesium: 1.8 mg/dL (ref 1.7–2.4)

## 2024-06-25 MED ORDER — KETOROLAC TROMETHAMINE 30 MG/ML IJ SOLN
30.0000 mg | Freq: Once | INTRAMUSCULAR | Status: AC
  Administered 2024-06-25: 30 mg via INTRAVENOUS
  Filled 2024-06-25: qty 1

## 2024-06-25 MED ORDER — METHOCARBAMOL 500 MG PO TABS: 500.0000 mg | ORAL_TABLET | Freq: Three times a day (TID) | ORAL | 0 refills | Status: DC | PRN

## 2024-06-25 MED ORDER — SODIUM CHLORIDE 0.9 % IV BOLUS (SEPSIS)
1000.0000 mL | Freq: Once | INTRAVENOUS | Status: AC
  Administered 2024-06-25: 1000 mL via INTRAVENOUS

## 2024-06-25 MED ORDER — METHOCARBAMOL 1000 MG/10ML IJ SOLN
500.0000 mg | Freq: Once | INTRAMUSCULAR | Status: AC
  Administered 2024-06-25: 500 mg via INTRAVENOUS
  Filled 2024-06-25: qty 5

## 2024-06-25 NOTE — Discharge Instructions (Signed)
 You may alternate over the counter Tylenol  1000 mg every 6 hours as needed for pain, fever and Ibuprofen 800 mg every 6-8 hours as needed for pain, fever.  Please take Ibuprofen with food.  Do not take more than 4000 mg of Tylenol  (acetaminophen ) in a 24 hour period.  Please stop your Flexeril  if it is not helping you.  We have provided you with a prescription for Robaxin  which may help with your pain better as this is what we gave you here in the emergency department.  I recommend rest for the next few days, increase water intake.  If you begin having involuntary movements again, I recommend close outpatient follow-up with neurology.

## 2024-06-25 NOTE — ED Provider Notes (Signed)
 [facility] Provider Note    Event Date/Time   First MD Initiated Contact with Patient 06/25/24 0209     (approximate)   History   Spasms   HPI  Danielle Mcdaniel is a 33 y.o. female with history of diabetes who presents to the emergency department with complaints of feeling like she is having muscle spasms all over.  She reports she has been having involuntary movements for several hours where she feels like her body is jerking.  She thinks she may be having a seizure.  She states the episodes occur in her entire body.  She is awake and alert during these episodes.  No tongue biting, incontinence.  No history of epilepsy.  Was seen in the emergency department earlier today after she injured her left ankle and is currently in a splint.  X-rays were negative for fracture.  She was discharged with prescription for Percocet and baclofen .  States she took these medications and they did not give her any relief.  No vomiting, diarrhea.  She reports feeling some tingling in her foot since she injured it but no other numbness, weakness.   History provided by patient.    Past Medical History:  Diagnosis Date   Diabetes mellitus without complication (HCC)     Past Surgical History:  Procedure Laterality Date   CESAREAN SECTION      MEDICATIONS:  Prior to Admission medications   Medication Sig Start Date End Date Taking? Authorizing Provider  baclofen  (LIORESAL ) 10 MG tablet Take 1 tablet (10 mg total) by mouth 3 (three) times daily for 7 days. 06/24/24 07/01/24  Gasper, Devere ORN, PA-C  oxyCODONE -acetaminophen  (PERCOCET) 5-325 MG tablet Take 1 tablet by mouth every 4 (four) hours as needed for severe pain (pain score 7-10). 06/24/24 06/24/25  Gasper Devere ORN, PA-C    Physical Exam   Triage Vital Signs: ED Triage Vitals  Encounter Vitals Group     BP 06/24/24 2214 (!) 132/102     Girls Systolic BP Percentile --      Girls Diastolic BP Percentile --      Boys  Systolic BP Percentile --      Boys Diastolic BP Percentile --      Pulse --      Resp 06/24/24 2214 18     Temp 06/24/24 2214 99 F (37.2 C)     Temp Source 06/24/24 2214 Oral     SpO2 06/24/24 2213 99 %     Weight 06/24/24 2214 260 lb (117.9 kg)     Height 06/24/24 2214 5' 6 (1.676 m)     Head Circumference --      Peak Flow --      Pain Score 06/24/24 2214 8     Pain Loc --      Pain Education --      Exclude from Growth Chart --     Most recent vital signs: Vitals:   06/24/24 2214 06/25/24 0437  BP: (!) 132/102 111/82  Pulse:  77  Resp: 18 17  Temp: 99 F (37.2 C) 97.8 F (36.6 C)  SpO2: 99% 100%    CONSTITUTIONAL: Alert, responds appropriately to questions. Well-appearing; well-nourished HEAD: Normocephalic, atraumatic EYES: Conjunctivae clear, pupils appear equal, sclera nonicteric ENT: normal nose; moist mucous membranes NECK: Supple, normal ROM CARD: RRR; S1 and S2 appreciated RESP: Normal chest excursion without splinting or tachypnea; breath sounds clear and equal bilaterally; no wheezes, no rhonchi, no rales, no hypoxia  or respiratory distress, speaking full sentences ABD/GI: Non-distended; soft, non-tender, no rebound, no guarding, no peritoneal signs BACK: The back appears normal EXT: Normal ROM in all joints; no deformity noted, no edema, left foot in a splint with normal capillary refill SKIN: Normal color for age and race; warm; no rash on exposed skin NEURO: Moves all extremities equally, normal speech, normal sensation, no facial asymmetry, no seizure-like activity but patient will have abnormal movements involving her entire body while sitting upright where her torso, head and upper extremities will jerk forward and then go backwards into the bed PSYCH: The patient's mood and manner are appropriate.   ED Results / Procedures / Treatments   LABS: (all labs ordered are listed, but only abnormal results are displayed) Labs Reviewed  COMPREHENSIVE  METABOLIC PANEL WITH GFR - Abnormal; Notable for the following components:      Result Value   Glucose, Bld 100 (*)    All other components within normal limits  CK - Abnormal; Notable for the following components:   Total CK 313 (*)    All other components within normal limits  CBC WITH DIFFERENTIAL/PLATELET  MAGNESIUM  HCG, QUANTITATIVE, PREGNANCY     EKG:     RADIOLOGY: My personal review and interpretation of imaging:    I have personally reviewed all radiology reports.   DG Foot Complete Left Result Date: 06/24/2024 CLINICAL DATA:  33 year old female status post injury when stepped in a hole. EXAM: LEFT FOOT - COMPLETE 3+ VIEW COMPARISON:  Left ankle series reported separately today. FINDINGS: Three views at 1127 hours. Bone mineralization is within normal limits. Soft tissue swelling redemonstrated at the ankle. Calcaneus appears intact. Joint spaces and alignment appear normal in the left foot. No foot fracture or dislocation is identified. IMPRESSION: No acute fracture or dislocation identified in the left foot. Electronically Signed   By: VEAR Hurst M.D.   On: 06/24/2024 11:41   DG Ankle Complete Left Result Date: 06/24/2024 CLINICAL DATA:  Pain and swelling. EXAM: LEFT ANKLE COMPLETE - 3+ VIEW COMPARISON:  None Available. FINDINGS: No evidence for an acute fracture. No subluxation or dislocation. Soft tissue swelling evident. IMPRESSION: Soft tissue swelling without acute bony findings. Electronically Signed   By: Camellia Candle M.D.   On: 06/24/2024 10:40     PROCEDURES:  Critical Care performed: No     Procedures    IMPRESSION / MDM / ASSESSMENT AND PLAN / ED COURSE  I reviewed the triage vital signs and the nursing notes.    Patient here for abnormal movements.  The patient is on the cardiac monitor to evaluate for evidence of arrhythmia and/or significant heart rate changes.   DIFFERENTIAL DIAGNOSIS (includes but not limited to):   Electrolyte derangement,  rhabdomyolysis, muscle spasms, muscle cramps, nonepileptic seizure-like activity, functional neurologic disorder, low suspicion for epilepsy, intracranial hemorrhage, intracranial mass, stroke, meningitis   Patient's presentation is most consistent with acute complicated illness / injury requiring diagnostic workup.   PLAN: Will obtain labs to check CK level, electrolytes, give IV fluids, Toradol , Robaxin  for symptomatic relief.  I do not feel antiepileptics or head imaging is indicated at this time.   MEDICATIONS GIVEN IN ED: Medications  sodium chloride  0.9 % bolus 1,000 mL (0 mLs Intravenous Stopped 06/25/24 0430)  methocarbamol  (ROBAXIN ) injection 500 mg (500 mg Intravenous Given 06/25/24 0307)  ketorolac  (TORADOL ) 30 MG/ML injection 30 mg (30 mg Intravenous Given 06/25/24 0252)     ED COURSE: Normal electrolytes.  Normal  hemoglobin.  CK mildly elevated which I think is likely from repeated movements.  She has received a liter of fluid.  Normal creatinine.  Patient has been sleeping since getting medication here and has no further abnormal movements.  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  I feel she is safe for discharge.  Given Robaxin  worked well for her, will discharge with prescription of the same.  Have advised her not to take her baclofen .   At this time, I do not feel there is any life-threatening condition present. I reviewed all nursing notes, vitals, pertinent previous records.  All lab and urine results, EKGs, imaging ordered have been independently reviewed and interpreted by myself.  I reviewed all available radiology reports from any imaging ordered this visit.  Based on my assessment, I feel the patient is safe to be discharged home without further emergent workup and can continue workup as an outpatient as needed. Discussed all findings, treatment plan as well as usual and customary return precautions.  They verbalize understanding and are comfortable with this plan.   Outpatient follow-up has been provided as needed.  All questions have been answered.    CONSULTS:  none   OUTSIDE RECORDS REVIEWED: Reviewed last family medicine note on 08/28/2023.       FINAL CLINICAL IMPRESSION(S) / ED DIAGNOSES   Final diagnoses:  Abnormal movements     Rx / DC Orders   ED Discharge Orders          Ordered    methocarbamol  (ROBAXIN ) 500 MG tablet  Every 8 hours PRN        06/25/24 0428             Note:  This document was prepared using Dragon voice recognition software and may include unintentional dictation errors.   Zeniyah Peaster, Josette SAILOR, [place] 06/25/24 [phone]

## 2024-07-03 MED ORDER — DICLOFENAC SODIUM 25 MG PO TBEC: 25.0000 mg | DELAYED_RELEASE_TABLET | Freq: Two times a day (BID) | ORAL | Status: AC

## 2024-07-28 DIAGNOSIS — N393 Stress incontinence (female) (male): Secondary | ICD-10-CM | POA: Insufficient documentation

## 2024-07-29 ENCOUNTER — Other Ambulatory Visit: Payer: Self-pay

## 2024-07-29 DIAGNOSIS — S93402D Sprain of unspecified ligament of left ankle, subsequent encounter: Secondary | ICD-10-CM

## 2024-09-14 ENCOUNTER — Encounter: Payer: Self-pay | Admitting: Neurology

## 2024-09-14 ENCOUNTER — Ambulatory Visit: Admitting: Neurology

## 2024-09-14 VITALS — BP 114/73 | HR 78 | Ht 67.0 in | Wt 267.0 lb

## 2024-09-14 DIAGNOSIS — R251 Tremor, unspecified: Secondary | ICD-10-CM | POA: Diagnosis not present

## 2024-09-14 DIAGNOSIS — M25572 Pain in left ankle and joints of left foot: Secondary | ICD-10-CM

## 2024-09-14 DIAGNOSIS — G8929 Other chronic pain: Secondary | ICD-10-CM | POA: Insufficient documentation

## 2024-09-14 NOTE — Patient Instructions (Signed)
 John D. Kit, MD  Address: [address], [place], [place] [address] Phone: [phone]

## 2024-09-14 NOTE — Progress Notes (Signed)
 Chief Complaint  Patient presents with   New Patient (Initial Visit)    Rm15, alone, referral for frequent falls, cerebellar dysfunction / Hoy Miu PA [address] [facility] [phone]      ASSESSMENT AND PLAN  Danielle Mcdaniel is a 33 y.o. female   Gait abnormality   Is clearly associated with her left ankle pain,  Will refer her to ankle specialist Dr. Kit for evaluation  Check TSH, previous laboratory evaluation showed mild elevation of CPK will repeat   DIAGNOSTIC DATA (LABS, IMAGING, TESTING) - I reviewed patient records, labs, notes, testing and imaging myself where available.   MEDICAL HISTORY:  Danielle Mcdaniel, is a 33 year old female seen in request by the primary care from [facility], [place] for evaluation of gait abnormality, initial evaluation was on September 14, 2024   History is obtained from the patient and review of electronic medical records. I personally reviewed pertinent available imaging films in PACS.   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  PHYSICAL EXAMNIATION:  Gen: NAD, conversant, well nourised, well groomed                     Cardiovascular: Regular rate rhythm, no peripheral edema, warm, nontender. Eyes: Conjunctivae clear without exudates or hemorrhage Neck: Supple, no carotid bruits. Pulmonary: Clear to auscultation bilaterally   NEUROLOGICAL EXAM:  MENTAL STATUS: Speech/cognition: Awake, alert, oriented to history taking and casual conversation CRANIAL NERVES: CN II: Visual fields are full to confrontation. Pupils are round equal and briskly reactive to light. CN III, IV, VI: extraocular movement are normal. No ptosis. CN V: Facial sensation is intact to light touch CN VII: Face is symmetric with normal eye closure  CN VIII: Hearing is normal to causal conversation. CN IX, X: Phonation is normal. CN XI: Head turning and shoulder shrug are intact  MOTOR: There is no pronator drift of out-stretched arms. Muscle bulk and tone are normal. Muscle strength is normal.  Left ankle in brace, tenderness upon deep palpitation  REFLEXES: Reflexes are 2+ and symmetric at the biceps, triceps, knees, and ankles.  Plantar responses are flexor.  SENSORY: Intact to light touch, pinprick and vibratory sensation are intact in fingers and toes.  COORDINATION: There is no trunk or limb dysmetria noted.  GAIT/STANCE: Antalgic,  REVIEW OF SYSTEMS:  Full 14 system review of systems performed and notable only for as above All other review of systems were negative.   ALLERGIES: No Active Allergies  HOME MEDICATIONS: Current Outpatient Medications  Medication Sig Dispense Refill   celecoxib  (CELEBREX) 100 MG capsule Take 100 mg by mouth 2 (two) times daily.     Current Facility-Administered Medications  Medication Dose Route Frequency Provider Last Rate Last Admin   diclofenac  (VOLTAREN ) EC tablet 25 mg  25 mg Oral BID         PAST MEDICAL HISTORY: Past Medical History:  Diagnosis Date   Diabetes mellitus without complication (HCC)     PAST SURGICAL HISTORY: Past Surgical History:  Procedure Laterality Date   CESAREAN SECTION      FAMILY HISTORY: History reviewed. No pertinent family history.  SOCIAL HISTORY: Social History   Socioeconomic History   Marital status: Significant Other    Spouse name: Not on file   Number of children: 3   Years of education: Not on file   Highest education level: Some college, no degree  Occupational History   Not on file  Tobacco Use   Smoking status: Never    Passive exposure: Never   Smokeless tobacco: Never  Vaping Use   Vaping status: Never Used  Substance and Sexual Activity   Alcohol use: Yes    Alcohol/week: 1.0 standard drink of alcohol    Types: 1 Standard drinks or equivalent per week    Comment: occ   Drug use: No   Sexual activity: Yes    Birth control/protection: None  Other Topics Concern   Not on file  Social History Narrative   Not on file   Social Drivers of Health   Financial Resource Strain: Low Risk  (07/28/2024)   Received from [facility]   Overall Financial Resource Strain (CARDIA)    How hard is it for you to pay for the very basics like food, housing, medical care, and heating?: Not hard at all  Recent Concern: Financial Resource Strain - Medium Risk (06/28/2024)   Received from [facility]   Overall Financial Resource Strain (CARDIA)    Difficulty of Paying Living Expenses: Somewhat hard  Food Insecurity: No Food Insecurity (07/28/2024)   Received from [facility]   Hunger Vital Sign    Within the past 12 months, you worried that your food would run out before  you got the money to buy more.: Never true    Within the past 12 months, the food you bought just didn't last and you didn't have money to get more.: Never true  Recent Concern: Food Insecurity - Food Insecurity Present (06/28/2024)   Received from [facility]   Hunger Vital Sign    Within the past 12 months, you worried that your food would run out before you got the money to buy more.: Sometimes true    Within the past 12 months, the food you bought just didn't last and you didn't have money to get more.: Sometimes true  Transportation Needs: No Transportation Needs (07/28/2024)   Received from [facility] - Transportation    In the past 12 months, has lack of transportation kept you from medical appointments or from getting medications?:  No    In the past 12 months, has lack of transportation kept you from meetings, work, or from getting things needed for daily living?: No  Physical Activity: Insufficiently Active (12/09/2022)   Received from [facility]   Exercise Vital Sign    On average, how many days per week do you engage in moderate to strenuous exercise (like a brisk walk)?: 1 day    On average, how many minutes do you engage in exercise at this level?: 60 min  Stress: Stress Concern Present (12/09/2022)   Received from [facility] - Occupational Stress Questionnaire    Feeling of Stress : Very much  Social Connections: Socially Isolated (12/09/2022)   Received [facility]   Social Connection and Isolation Panel    In a typical week, how many times do you talk on the phone with family, friends, or neighbors?: More than three times a week    How often do you get together with friends or relatives?: Once a week    How often do you attend church or religious services?: Never    Do you belong to any clubs or organizations such as church groups, unions, fraternal or athletic groups, or school groups?: No     How often do you attend meetings of the clubs or organizations you belong to?: Never    Are you married, widowed, divorced, separated, never married, or living with a partner?: Never married  Intimate Partner Violence: Not At Risk (12/17/2022)   Received from [facility]   Humiliation, Afraid, Rape, and Kick questionnaire    Within the last year, have you been afraid of your partner or ex-partner?: No    Within the last year, have you been humiliated or emotionally abused in other ways by your partner or ex-partner?: No    Within the last year, have you been kicked, hit, slapped, or otherwise physically hurt by your partner or ex-partner?: No    Within the last year, have you been raped or forced to have any kind of sexual activity by your partner or ex-partner?: No      Modena Callander, M.D. Ph.D.  [facility] [address], [address] [place], [place] [address] Ph: [phone] Fax: [phone]  CC:  Danielle Mcdaniel [address] [place],  [place] [address]  Nelwyn Rhonda Eden, MD

## 2024-09-15 ENCOUNTER — Telehealth: Payer: Self-pay | Admitting: Neurology

## 2024-09-15 ENCOUNTER — Ambulatory Visit: Payer: Self-pay | Admitting: Neurology

## 2024-09-15 LAB — TSH: TSH: 1.08 u[IU]/mL (ref 0.450–4.500)

## 2024-09-15 LAB — CK: Total CK: 153 U/L (ref 32–182)

## 2024-09-15 NOTE — Telephone Encounter (Signed)
Referral  for orthopedic surgery fax to [facility]. Phone: [phone], Fax: [phone]

## 2024-09-22 ENCOUNTER — Other Ambulatory Visit: Payer: Self-pay

## 2024-09-22 DIAGNOSIS — S93402D Sprain of unspecified ligament of left ankle, subsequent encounter: Secondary | ICD-10-CM
